# Patient Record
Sex: Female | Born: 1980 | Marital: Single | Smoking: Current every day smoker
Health system: Southern US, Community
[De-identification: ages and names within clinical notes are randomized; demographics above are authoritative.]

## PROBLEM LIST (undated history)

## (undated) DIAGNOSIS — D219 Benign neoplasm of connective and other soft tissue, unspecified: Secondary | ICD-10-CM

## (undated) DIAGNOSIS — K219 Gastro-esophageal reflux disease without esophagitis: Secondary | ICD-10-CM

## (undated) DIAGNOSIS — D500 Iron deficiency anemia secondary to blood loss (chronic): Secondary | ICD-10-CM

## (undated) DIAGNOSIS — T7840XA Allergy, unspecified, initial encounter: Secondary | ICD-10-CM

## (undated) DIAGNOSIS — D259 Leiomyoma of uterus, unspecified: Secondary | ICD-10-CM

## (undated) HISTORY — PX: WISDOM TOOTH EXTRACTION: SHX21

## (undated) HISTORY — DX: Gastro-esophageal reflux disease without esophagitis: K21.9

## (undated) HISTORY — DX: Allergy, unspecified, initial encounter: T78.40XA

---

## 2003-12-30 HISTORY — PX: WISDOM TOOTH EXTRACTION: SHX21

## 2015-02-21 ENCOUNTER — Ambulatory Visit (INDEPENDENT_AMBULATORY_CARE_PROVIDER_SITE_OTHER): Payer: 59 | Admitting: Nurse Practitioner

## 2015-02-21 ENCOUNTER — Encounter: Payer: Self-pay | Admitting: Nurse Practitioner

## 2015-02-21 DIAGNOSIS — N760 Acute vaginitis: Secondary | ICD-10-CM

## 2015-02-21 NOTE — Progress Notes (Signed)
34 y.o. G0 Single  Caucasian Fe here for NGYN problem visit.  She complains of vaginal discharge for a month.  Discharge is clear to white. No itching, burning,  There is an odor. 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   Same female partner for 5 months.  Last test for STD 05/2014.  Menses regular, flow for 5 days, some cramps.  Some PMS with breast tenderness.  Moved from [place] end of June to care for [facility] who passed last month.  She is going back to [place] tomorrow for a week and then to return back to [place].  Patient's last menstrual period was 02/09/2015.          Sexually active: Yes.    The current method of family planning is none and condoms.    Exercising: Yes.    Running 1 - 2 days weekly Smoker:  no  Health Maintenance: Pap:  05/2014 - Normal per pt - does not recall if HPV testing was done TDaP: within 10 years  Labs: ? At last AEX   reports that she has never smoked. She has never used smokeless tobacco. She reports that she drinks about 1.2 oz of alcohol per week. She reports that she does not use illicit drugs.  History reviewed. No pertinent past medical history.  Past Surgical History  Procedure Laterality Date  . Wisdom tooth extraction      No current outpatient prescriptions on file.   No current facility-administered medications for this visit.    Family History  Problem Relation Age of Onset  . Hyperlipidemia Mother   . Cancer Maternal Grandfather     Lung  . Breast cancer Paternal Grandmother     ROS:  Pertinent items are noted in HPI.  Otherwise, a comprehensive ROS was negative.  Exam:   BP 110/70 mmHg  Pulse 88  Resp 18  Ht 5' 4.5" (1.638 m)  Wt 163 lb (73.936 kg)  BMI 27.56 kg/m2  LMP 02/09/2015 Height: 5' 4.5" (163.8 cm) Ht Readings from Last 3 Encounters:  02/21/15 5' 4.5" (1.638 m)    General appearance: alert, cooperative and appears stated age Head: Normocephalic, without obvious abnormality,  atraumatic Abdomen: soft, non-tender; no masses,  no organomegaly Extremities: extremities normal, atraumatic, no cyanosis or edema Skin: Skin color, texture, turgor normal. No rashes or lesions Neurologic: Grossly normal   Pelvic: External genitalia:  no lesions              Urethra:  normal appearing urethra with no masses, tenderness or lesions              Bartholin's and Skene's: normal                 Vagina: normal appearing vagina with normal color and thin white to clear discharge, no lesions  Affirm test and GC Chl is obtained.              Cervix: anteverted there was spotting after GC/ Chl testing              Pap taken: No. Bimanual Exam:  Uterus:  normal size, contour, position, consistency, mobility, non-tender              Adnexa: no mass, fullness, tenderness                Chaperone present: Yes  A:  Vaginitis  History of chronic yeast  AEX is due 05/2015  P:  Reviewed health and wellness pertinent to exam  Will follow with test results. (if Metrogel - follow with Diflucan)  She is aware that treatment with Metrogel or Flagyl could cause some GI irritation if taken with ETOH.  She is going home and to a party this weekend -so may delay treatment of infection later or early next week.    An After Visit Summary was printed and given to the patient.

## 2015-02-21 NOTE — Patient Instructions (Signed)
Bacterial Vaginosis Bacterial vaginosis is a vaginal infection that occurs when the normal balance of bacteria in the vagina is disrupted. It results from an overgrowth of certain bacteria. This is the most common vaginal infection in women of childbearing age. Treatment is important to prevent complications, especially in pregnant women, as it can cause a premature delivery. CAUSES  Bacterial vaginosis is caused by an increase in harmful bacteria that are normally present in smaller amounts in the vagina. Several different kinds of bacteria can cause bacterial vaginosis. However, the reason that the condition develops is not fully understood. RISK FACTORS Certain activities or behaviors can put you at an increased risk of developing bacterial vaginosis, including:  Having a new sex partner or multiple sex partners.  Douching.  Using an intrauterine device (IUD) for contraception. Women do not get bacterial vaginosis from toilet seats, bedding, swimming pools, or contact with objects around them. SIGNS AND SYMPTOMS  Some women with bacterial vaginosis have no signs or symptoms. Common symptoms include:  Grey vaginal discharge.  A fishlike odor with discharge, especially after sexual intercourse.  Itching or burning of the vagina and vulva.  Burning or pain with urination. DIAGNOSIS  Your health care provider will take a medical history and examine the vagina for signs of bacterial vaginosis. A sample of vaginal fluid may be taken. Your health care provider will look at this sample under a microscope to check for bacteria and abnormal cells. A vaginal pH test may also be done.  TREATMENT  Bacterial vaginosis may be treated with antibiotic medicines. These may be given in the form of a pill or a vaginal cream. A second round of antibiotics may be prescribed if the condition comes back after treatment.  HOME CARE INSTRUCTIONS   Only take over-the-counter or prescription medicines as  directed by your health care provider.  If antibiotic medicine was prescribed, take it as directed. Make sure you finish it even if you start to feel better.  Do not have sex until treatment is completed.  Tell all sexual partners that you have a vaginal infection. They should see their health care provider and be treated if they have problems, such as a mild rash or itching.  Practice safe sex by using condoms and only having one sex partner. SEEK MEDICAL CARE IF:   Your symptoms are not improving after 3 days of treatment.  You have increased discharge or pain.  You have a fever. MAKE SURE YOU:   Understand these instructions.  Will watch your condition.  Will get help right away if you are not doing well or get worse. FOR MORE INFORMATION  Centers for Disease Control and Prevention, Division of STD Prevention: www.cdc.gov/std American Sexual Health Association (ASHA): www.ashastd.org  Document Released: 12/15/2005 Document Revised: 10/05/2013 Document Reviewed: 07/27/2013 ExitCare Patient Information 2015 ExitCare[employer] LLC. This information is not intended to replace advice given to you by your health care provider. Make sure you discuss any questions you have with your health care provider.  

## 2015-02-22 ENCOUNTER — Telehealth: Payer: Self-pay | Admitting: *Deleted

## 2015-02-22 LAB — WET PREP BY MOLECULAR PROBE
CANDIDA SPECIES: NEGATIVE
GARDNERELLA VAGINALIS: POSITIVE — AB
TRICHOMONAS VAG: NEGATIVE

## 2015-02-22 MED ORDER — METRONIDAZOLE 500 MG PO TABS: 500.0000 mg | ORAL_TABLET | Freq: Two times a day (BID) | ORAL | Status: DC

## 2015-02-22 MED ORDER — FLUCONAZOLE 150 MG PO TABS: 150.0000 mg | ORAL_TABLET | Freq: Once | ORAL | Status: DC

## 2015-02-22 NOTE — Progress Notes (Signed)
Encounter reviewed by Dr. Talon Witting Silva.  

## 2015-02-22 NOTE — Telephone Encounter (Signed)
-----   Message from Milford Cage, [place] sent at 02/22/2015  8:21 AM EST ----- Let patient know that she did have BV and RX will be sent to her pharmacy - does she prefer one here or [place]?  She will also be followed with Diflucan after the Metrogel since she has history of chronic yeast.

## 2015-02-22 NOTE — Addendum Note (Signed)
Addended by: Alfonzo Feller on: 02/22/2015 10:16 AM   Modules accepted: Miquel Dunn

## 2015-02-22 NOTE — Telephone Encounter (Addendum)
Patient notified. She said she would like to try Flagyl and would like rx to be sent in to pharmacy here in [place] at [place] on [address]. Per Ms. Patty okay to send in Flagyl 500 mg BID #14/0 rfs and Diflucan 150 mg #2/0 rfs. Both rx's sent to [facility], patient is aware.  Routed to provider for review, encounter closed.

## 2015-02-23 LAB — IPS N GONORRHOEA AND CHLAMYDIA BY PCR

## 2015-07-04 ENCOUNTER — Ambulatory Visit (INDEPENDENT_AMBULATORY_CARE_PROVIDER_SITE_OTHER): Payer: 59 | Admitting: Nurse Practitioner

## 2015-07-04 ENCOUNTER — Encounter: Payer: Self-pay | Admitting: Nurse Practitioner

## 2015-07-04 VITALS — BP 110/72 | HR 78 | Resp 14 | Ht 64.5 in | Wt 159.8 lb

## 2015-07-04 DIAGNOSIS — Z113 Encounter for screening for infections with a predominantly sexual mode of transmission: Secondary | ICD-10-CM

## 2015-07-04 DIAGNOSIS — Z0000 Encounter for general adult medical examination without abnormal findings: Secondary | ICD-10-CM | POA: Diagnosis not present

## 2015-07-04 DIAGNOSIS — Z01419 Encounter for gynecological examination (general) (routine) without abnormal findings: Secondary | ICD-10-CM

## 2015-07-04 LAB — POCT URINALYSIS DIPSTICK
Leukocytes, UA: NEGATIVE
PH UA: 5
Urobilinogen, UA: NEGATIVE

## 2015-07-04 NOTE — Patient Instructions (Signed)
General topics  Next pap or exam is  due in 1 year Take a Women's multivitamin Take 1200 mg. of calcium daily - prefer dietary If any concerns in interim to call back  Breast Self-Awareness Practicing breast self-awareness may pick up problems early, prevent significant medical complications, and possibly save your life. By practicing breast self-awareness, you can become familiar with how your breasts look and feel and if your breasts are changing. This allows you to notice changes early. It can also offer you some reassurance that your breast health is good. One way to learn what is normal for your breasts and whether your breasts are changing is to do a breast self-exam. If you find a lump or something that was not present in the past, it is best to contact your caregiver right away. Other findings that should be evaluated by your caregiver include nipple discharge, especially if it is bloody; skin changes or reddening; areas where the skin seems to be pulled in (retracted); or new lumps and bumps. Breast pain is seldom associated with cancer (malignancy), but should also be evaluated by a caregiver. BREAST SELF-EXAM The best time to examine your breasts is 5 7 days after your menstrual period is over.  ExitCare Patient Information 2013 ExitCare, LLC.   Exercise to Stay Healthy Exercise helps you become and stay healthy. EXERCISE IDEAS AND TIPS Choose exercises that:  You enjoy.  Fit into your day. You do not need to exercise really hard to be healthy. You can do exercises at a slow or medium level and stay healthy. You can:  Stretch before and after working out.  Try yoga, Pilates, or tai chi.  Lift weights.  Walk fast, swim, jog, run, climb stairs, bicycle, dance, or rollerskate.  Take aerobic classes. Exercises that burn about 150 calories:  Running 1  miles in 15 minutes.  Playing volleyball for 45 to 60 minutes.  Washing and waxing a car for 45 to 60  minutes.  Playing touch football for 45 minutes.  Walking 1  miles in 35 minutes.  Pushing a stroller 1  miles in 30 minutes.  Playing basketball for 30 minutes.  Raking leaves for 30 minutes.  Bicycling 5 miles in 30 minutes.  Walking 2 miles in 30 minutes.  Dancing for 30 minutes.  Shoveling snow for 15 minutes.  Swimming laps for 20 minutes.  Walking up stairs for 15 minutes.  Bicycling 4 miles in 15 minutes.  Gardening for 30 to 45 minutes.  Jumping rope for 15 minutes.  Washing windows or floors for 45 to 60 minutes. Document Released: 01/17/2011 Document Revised: 03/08/2012 Document Reviewed: 01/17/2011 ExitCare Patient Information 2013 ExitCare, LLC.   Other topics ( that may be useful information):    Sexually Transmitted Disease Sexually transmitted disease (STD) refers to any infection that is passed from person to person during sexual activity. This may happen by way of saliva, semen, blood, vaginal mucus, or urine. Common STDs include:  Gonorrhea.  Chlamydia.  Syphilis.  HIV/AIDS.  Genital herpes.  Hepatitis B and C.  Trichomonas.  Human papillomavirus (HPV).  Pubic lice. CAUSES  An STD may be spread by bacteria, virus, or parasite. A person can get an STD by:  Sexual intercourse with an infected person.  Sharing sex toys with an infected person.  Sharing needles with an infected person.  Having intimate contact with the genitals, mouth, or rectal areas of an infected person. SYMPTOMS  Some people may not have any symptoms, but   they can still pass the infection to others. Different STDs have different symptoms. Symptoms include:  Painful or bloody urination.  Pain in the pelvis, abdomen, vagina, anus, throat, or eyes.  Skin rash, itching, irritation, growths, or sores (lesions). These usually occur in the genital or anal area.  Abnormal vaginal discharge.  Penile discharge in men.  Soft, flesh-colored skin growths in the  genital or anal area.  Fever.  Pain or bleeding during sexual intercourse.  Swollen glands in the groin area.  Yellow skin and eyes (jaundice). This is seen with hepatitis. DIAGNOSIS  To make a diagnosis, your caregiver may:  Take a medical history.  Perform a physical exam.  Take a specimen (culture) to be examined.  Examine a sample of discharge under a microscope.  Perform blood test TREATMENT   Chlamydia, gonorrhea, trichomonas, and syphilis can be cured with antibiotic medicine.  Genital herpes, hepatitis, and HIV can be treated, but not cured, with prescribed medicines. The medicines will lessen the symptoms.  Genital warts from HPV can be treated with medicine or by freezing, burning (electrocautery), or surgery. Warts may come back.  HPV is a virus and cannot be cured with medicine or surgery.However, abnormal areas may be followed very closely by your caregiver and may be removed from the cervix, vagina, or vulva through office procedures or surgery. If your diagnosis is confirmed, your recent sexual partners need treatment. This is true even if they are symptom-free or have a negative culture or evaluation. They should not have sex until their caregiver says it is okay. HOME CARE INSTRUCTIONS  All sexual partners should be informed, tested, and treated for all STDs.  Take your antibiotics as directed. Finish them even if you start to feel better.  Only take over-the-counter or prescription medicines for pain, discomfort, or fever as directed by your caregiver.  Rest.  Eat a balanced diet and drink enough fluids to keep your urine clear or pale yellow.  Do not have sex until treatment is completed and you have followed up with your caregiver. STDs should be checked after treatment.  Keep all follow-up appointments, Pap tests, and blood tests as directed by your caregiver.  Only use latex condoms and water-soluble lubricants during sexual activity. Do not use  petroleum jelly or oils.  Avoid alcohol and illegal drugs.  Get vaccinated for HPV and hepatitis. If you have not received these vaccines in the past, talk to your caregiver about whether one or both might be right for you.  Avoid risky sex practices that can break the skin. The only way to avoid getting an STD is to avoid all sexual activity.Latex condoms and dental dams (for oral sex) will help lessen the risk of getting an STD, but will not completely eliminate the risk. SEEK MEDICAL CARE IF:   You have a fever.  You have any new or worsening symptoms. Document Released: 03/07/2003 Document Revised: 03/08/2012 Document Reviewed: 03/14/2011 [facility] Patient Information 2013 [employer].    Domestic Abuse You are being battered or abused if someone close to you hits, pushes, or physically hurts you in any way. You also are being abused if you are forced into activities. You are being sexually abused if you are forced to have sexual contact of any kind. You are being emotionally abused if you are made to feel worthless or if you are constantly threatened. It is important to remember that help is available. No one has the right to abuse you. PREVENTION OF FURTHER  ABUSE  Learn the warning signs of danger. This varies with situations but may include: the use of alcohol, threats, isolation from friends and family, or forced sexual contact. Leave if you feel that violence is going to occur.  If you are attacked or beaten, report it to the police so the abuse is documented. You do not have to press charges. The police can protect you while you or the attackers are leaving. Get the officer's name and badge number and a copy of the report.  Find someone you can trust and tell them what is happening to you: your caregiver, a nurse, clergy member, close friend or family member. Feeling ashamed is natural, but remember that you have done nothing wrong. No one deserves abuse. Document Released:  12/12/2000 Document Revised: 03/08/2012 Document Reviewed: 02/20/2011 ExitCare Patient Information 2013 ExitCare, LLC.    How Much is Too Much Alcohol? Drinking too much alcohol can cause injury, accidents, and health problems. These types of problems can include:   Car crashes.  Falls.  Family fighting (domestic violence).  Drowning.  Fights.  Injuries.  Burns.  Damage to certain organs.  Having a baby with birth defects. ONE DRINK CAN BE TOO MUCH WHEN YOU ARE:  Working.  Pregnant or breastfeeding.  Taking medicines. Ask your doctor.  Driving or planning to drive. If you or someone you know has a drinking problem, get help from a doctor.  Document Released: 10/11/2009 Document Revised: 03/08/2012 Document Reviewed: 10/11/2009 ExitCare Patient Information 2013 ExitCare, LLC.   Smoking Hazards Smoking cigarettes is extremely bad for your health. Tobacco smoke has over 200 known poisons in it. There are over 60 chemicals in tobacco smoke that cause cancer. Some of the chemicals found in cigarette smoke include:   Cyanide.  Benzene.  Formaldehyde.  Methanol (wood alcohol).  Acetylene (fuel used in welding torches).  Ammonia. Cigarette smoke also contains the poisonous gases nitrogen oxide and carbon monoxide.  Cigarette smokers have an increased risk of many serious medical problems and Smoking causes approximately:  90% of all lung cancer deaths in men.  80% of all lung cancer deaths in women.  90% of deaths from chronic obstructive lung disease. Compared with nonsmokers, smoking increases the risk of:  Coronary heart disease by 2 to 4 times.  Stroke by 2 to 4 times.  Men developing lung cancer by 23 times.  Women developing lung cancer by 13 times.  Dying from chronic obstructive lung diseases by 12 times.  . Smoking is the most preventable cause of death and disease in our society.  WHY IS SMOKING ADDICTIVE?  Nicotine is the chemical  agent in tobacco that is capable of causing addiction or dependence.  When you smoke and inhale, nicotine is absorbed rapidly into the bloodstream through your lungs. Nicotine absorbed through the lungs is capable of creating a powerful addiction. Both inhaled and non-inhaled nicotine may be addictive.  Addiction studies of cigarettes and spit tobacco show that addiction to nicotine occurs mainly during the teen years, when young people begin using tobacco products. WHAT ARE THE BENEFITS OF QUITTING?  There are many health benefits to quitting smoking.   Likelihood of developing cancer and heart disease decreases. Health improvements are seen almost immediately.  Blood pressure, pulse rate, and breathing patterns start returning to normal soon after quitting. QUITTING SMOKING   American Lung Association - 1-800-LUNGUSA  American Cancer Society - 1-800-ACS-2345 Document Released: 01/22/2005 Document Revised: 03/08/2012 Document Reviewed: 09/26/2009 ExitCare Patient Information 2013 ExitCare,   LLC.   Stress Management Stress is a state of physical or mental tension that often results from changes in your life or normal routine. Some common causes of stress are:  Death of a loved one.  Injuries or severe illnesses.  Getting fired or changing jobs.  Moving into a new home. Other causes may be:  Sexual problems.  Business or financial losses.  Taking on a large debt.  Regular conflict with someone at home or at work.  Constant tiredness from lack of sleep. It is not just bad things that are stressful. It may be stressful to:  Win the lottery.  Get married.  Buy a new car. The amount of stress that can be easily tolerated varies from person to person. Changes generally cause stress, regardless of the types of change. Too much stress can affect your health. It may lead to physical or emotional problems. Too little stress (boredom) may also become stressful. SUGGESTIONS TO  REDUCE STRESS:  Talk things over with your family and friends. It often is helpful to share your concerns and worries. If you feel your problem is serious, you may want to get help from a professional counselor.  Consider your problems one at a time instead of lumping them all together. Trying to take care of everything at once may seem impossible. List all the things you need to do and then start with the most important one. Set a goal to accomplish 2 or 3 things each day. If you expect to do too many in a single day you will naturally fail, causing you to feel even more stressed.  Do not use alcohol or drugs to relieve stress. Although you may feel better for a short time, they do not remove the problems that caused the stress. They can also be habit forming.  Exercise regularly - at least 3 times per week. Physical exercise can help to relieve that "uptight" feeling and will relax you.  The shortest distance between despair and hope is often a good night's sleep.  Go to bed and get up on time allowing yourself time for appointments without being rushed.  Take a short "time-out" period from any stressful situation that occurs during the day. Close your eyes and take some deep breaths. Starting with the muscles in your face, tense them, hold it for a few seconds, then relax. Repeat this with the muscles in your neck, shoulders, hand, stomach, back and legs.  Take good care of yourself. Eat a balanced diet and get plenty of rest.  Schedule time for having fun. Take a break from your daily routine to relax. HOME CARE INSTRUCTIONS   Call if you feel overwhelmed by your problems and feel you can no longer manage them on your own.  Return immediately if you feel like hurting yourself or someone else. Document Released: 06/10/2001 Document Revised: 03/08/2012 Document Reviewed: 01/31/2008 ExitCare Patient Information 2013 ExitCare, LLC.  

## 2015-07-04 NOTE — Progress Notes (Signed)
34 y.o. G0P0 Single Caucasian Fe here for annual exam.  Menses is now at 3-5 days, moderate to light flow.  Some cramps and relieved with OTC NSAID'S.  New partner for 2 weeks.  Ended previous relationship in March.  In the past before moving here she was on OCP for a short time but had amenorrhea and did not like that.  Prefers not to be on OCP.  She is aware of condom failure and back up Plan B. She is a Marine scientist at [employer].  Patient's last menstrual period was 06/17/2015 (exact date).          Sexually active: Yes.    The current method of family planning is condoms sometimes.    Exercising: Yes.    running Smoker:  no  Health Maintenance: Pap:  June 2015  TDaP:  About 7 years Labs: UA: negative    Hgb: 13.9   reports that she has never smoked. She has never used smokeless tobacco. She reports that she drinks about 1.2 oz of alcohol per week. She reports that she does not use illicit drugs.  History reviewed. No pertinent past medical history.  Past Surgical History  Procedure Laterality Date  . Wisdom tooth extraction      Current Outpatient Prescriptions  Medication Sig Dispense Refill  . fluconazole (DIFLUCAN) 150 MG tablet Take 1 tablet (150 mg total) by mouth once. (Patient not taking: Reported on 07/04/2015) 2 tablet 0  . metroNIDAZOLE (FLAGYL) 500 MG tablet Take 1 tablet (500 mg total) by mouth 2 (two) times daily. (Patient not taking: Reported on 07/04/2015) 14 tablet 0   No current facility-administered medications for this visit.    Family History  Problem Relation Age of Onset  . Hyperlipidemia Mother   . Cancer Maternal Grandfather     Lung  . Breast cancer Paternal Grandmother     ROS:  Pertinent items are noted in HPI.  Otherwise, a comprehensive ROS was negative.  Exam:   BP 110/72 mmHg  Pulse 78  Resp 14  Ht 5' 4.5" (1.638 m)  Wt 159 lb 12.8 oz (72.485 kg)  BMI 27.02 kg/m2  LMP 06/17/2015 (Exact Date) Height: 5' 4.5" (163.8 cm) Ht Readings from Last 3  Encounters:  07/04/15 5' 4.5" (1.638 m)  02/21/15 5' 4.5" (1.638 m)    General appearance: alert, cooperative and appears stated age Head: Normocephalic, without obvious abnormality, atraumatic Neck: no adenopathy, supple, symmetrical, trachea midline and thyroid normal to inspection and palpation Lungs: clear to auscultation bilaterally Breasts: normal appearance, no masses or tenderness Heart: regular rate and rhythm Abdomen: soft, non-tender; no masses,  no organomegaly Extremities: extremities normal, atraumatic, no cyanosis or edema Skin: Skin color, texture, turgor normal. No rashes or lesions Lymph nodes: Cervical, supraclavicular, and axillary nodes normal. No abnormal inguinal nodes palpated Neurologic: Grossly normal   Pelvic: External genitalia:  no lesions              Urethra:  normal appearing urethra with no masses, tenderness or lesions              Bartholin's and Skene's: normal                 Vagina: normal appearing vagina with normal color and discharge, no lesions              Cervix: anteverted              Pap taken: Yes.   Bimanual Exam:  Uterus:  normal size, contour, position, consistency, mobility, non-tender              Adnexa: no mass, fullness, tenderness               Rectovaginal: Confirms               Anus:  normal sphincter tone, no lesions  Chaperone present: Yes  A:  Well Woman with normal exam  Condoms for contraception and happy with choice  R/O STD's  P:   Reviewed health and wellness pertinent to exam  Pap smear as above  Will follow with labs  Counseled on breast self exam, STD prevention, HIV risk factors and prevention, adequate intake of calcium and vitamin D, diet and exercise return annually or prn  An After Visit Summary was printed and given to the patient.

## 2015-07-05 LAB — STD PANEL
HIV 1&2 Ab, 4th Generation: NONREACTIVE
Hepatitis B Surface Ag: NEGATIVE

## 2015-07-06 LAB — IPS N GONORRHOEA AND CHLAMYDIA BY PCR

## 2015-07-06 LAB — IPS PAP TEST WITH HPV

## 2015-07-09 LAB — HEMOGLOBIN, FINGERSTICK: Hemoglobin, fingerstick: 13.9 g/dL (ref 12.0–16.0)

## 2015-07-09 NOTE — Progress Notes (Signed)
Encounter reviewed by Dr. Lekisha Mcghee Amundson C. Silva.  

## 2015-07-20 ENCOUNTER — Telehealth: Payer: Self-pay | Admitting: *Deleted

## 2015-07-20 NOTE — Telephone Encounter (Signed)
Unable to reach patient at time of Pre-Visit Call.  Left message for patient to return call when available.    

## 2015-07-23 ENCOUNTER — Encounter: Payer: Self-pay | Admitting: Medical

## 2015-07-23 ENCOUNTER — Ambulatory Visit (INDEPENDENT_AMBULATORY_CARE_PROVIDER_SITE_OTHER): Payer: 59 | Admitting: Medical

## 2015-07-23 VITALS — BP 126/85 | HR 76 | Temp 98.6°F | Ht 63.75 in | Wt 157.2 lb

## 2015-07-23 DIAGNOSIS — D179 Benign lipomatous neoplasm, unspecified: Secondary | ICD-10-CM | POA: Diagnosis not present

## 2015-07-23 DIAGNOSIS — J301 Allergic rhinitis due to pollen: Secondary | ICD-10-CM | POA: Diagnosis not present

## 2015-07-23 DIAGNOSIS — K219 Gastro-esophageal reflux disease without esophagitis: Secondary | ICD-10-CM | POA: Diagnosis not present

## 2015-07-23 DIAGNOSIS — J309 Allergic rhinitis, unspecified: Secondary | ICD-10-CM | POA: Insufficient documentation

## 2015-07-23 NOTE — Patient Instructions (Signed)
Lipoma By history and exam I think this does favor lipoma. Since has grown and uncomfortable will refer to general surgeon for evaluation.   Continue routine womans health with gyn.  If you desire CPE with [place] to include fasting labs can schedule appointment.  Follow up here as needed prior to surgeon referral. Anderson Malta and Pleas Koch should be calling you with appointment date.

## 2015-07-23 NOTE — Progress Notes (Signed)
Pre visit review using our clinic review tool, if applicable. No additional management support is needed unless otherwise documented below in the visit note. 

## 2015-07-23 NOTE — Assessment & Plan Note (Signed)
By history and exam I think this does favor lipoma. Since has grown and uncomfortable will refer to general surgeon for evaluation.

## 2015-07-23 NOTE — Assessment & Plan Note (Signed)
Spring allergies are worst per pt. None current.

## 2015-07-23 NOTE — Progress Notes (Signed)
   Subjective:    Patient ID: Stacey Moore, female    DOB: 1981/11/25, 34 y.o.   MRN: [id number]  HPI  I have reviewed pt PMH, PSH, FH, Social History and Surgical History  Seasonal allergies- spring worse.  Jerrye Bushy- Pt states couples of time a week. Pt notices more at times at night. Pt does have some today. Burning sensation up esophagus. Pt yesterday subway for dinner. But [employer] for lunch yesterday. Chicken nuggets and fries. Usually one sodas.(regular soda).  Pt states moved she has knot on back of her neck. Was about size of quarter for 5 years. Just past year the area has enlarged. Also hurts on rom of her neck.  Told before she moved here was a lipoma.  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 No exercise, 1 soda a day, single- no children.   LMP- July 14, 2015.       Review of Systems  Constitutional: Negative for fever and chills.  Respiratory: Negative for cough, chest tightness and wheezing.   Cardiovascular: Negative for chest pain and palpitations.  Musculoskeletal: Negative for back pain.  Skin: Negative for rash.       Lt trapezius mass. Probable lipoma.  Neurological: Negative for dizziness and headaches.  Hematological: Negative for adenopathy. Does not bruise/bleed easily.  Psychiatric/Behavioral: Negative for behavioral problems and confusion.    Past Medical History  Diagnosis Date  . Allergy   . GERD (gastroesophageal reflux disease)     History   Social History  . Marital Status: Single    Spouse Name: N/A  . Number of Children: N/A  . Years of Education: N/A   Occupational History  . Not on file.   Social History Main Topics  . Smoking status: Never Smoker   . Smokeless tobacco: Never Used  . Alcohol Use: 1.2 oz/week    1 Glasses of wine, 1 Cans of beer per week     Comment: socially couple of drinks. maybe 1-2 times a week.  . Drug Use: No  . Sexual Activity: Yes    Birth Control/ Protection: Condom     Comment: same sex relationship    Other Topics Concern  . Not on file   Social History Narrative    Past Surgical History  Procedure Laterality Date  . Wisdom tooth extraction      Family History  Problem Relation Age of Onset  . Hyperlipidemia Mother   . Cancer Maternal Grandfather     Lung  . Breast cancer Paternal Grandmother     No Known Allergies  No current outpatient prescriptions on file prior to visit.   No current facility-administered medications on file prior to visit.    BP 126/85 mmHg  Pulse 76  Temp(Src) 98.6 F (37 C) (Oral)  Ht 5' 3.75" (1.619 m)  Wt 157 lb 3.2 oz (71.305 kg)  BMI 27.20 kg/m2  SpO2 100%  LMP 06/14/2015       Objective:   Physical Exam  General- No acute distress. Pleasant patient. Neck- Full range of motion, no jvd(but on rom notes posterior left trapezius does hurt some) Lungs- Clear, even and unlabored. Heart- regular rate and rhythm. Neurologic- CNII- XII grossly intact. Derm- left side trapezius region approximate 4 cm x 4 cm lipoma like mass.       Assessment & Plan:

## 2015-07-23 NOTE — Assessment & Plan Note (Signed)
Recommend better diet and use occasional zantac otc.

## 2015-11-16 ENCOUNTER — Ambulatory Visit (HOSPITAL_COMMUNITY): Admission: RE | Admit: 2015-11-16 | Payer: 59 | Source: Ambulatory Visit | Admitting: General Surgery

## 2015-11-16 ENCOUNTER — Encounter (HOSPITAL_COMMUNITY): Admission: RE | Payer: Self-pay | Source: Ambulatory Visit

## 2015-11-16 SURGERY — EXCISION LIPOMA
Anesthesia: General | Site: Back

## 2016-02-01 ENCOUNTER — Encounter: Payer: Self-pay | Admitting: Medical

## 2016-02-01 ENCOUNTER — Ambulatory Visit (INDEPENDENT_AMBULATORY_CARE_PROVIDER_SITE_OTHER): Payer: 59 | Admitting: Medical

## 2016-02-01 VITALS — BP 120/80 | HR 81 | Temp 98.1°F | Ht 63.75 in | Wt 154.0 lb

## 2016-02-01 DIAGNOSIS — R1013 Epigastric pain: Secondary | ICD-10-CM

## 2016-02-01 LAB — CBC WITH DIFFERENTIAL/PLATELET
BASOS PCT: 0.3 % (ref 0.0–3.0)
Basophils Absolute: 0 10*3/uL (ref 0.0–0.1)
EOS ABS: 0.5 10*3/uL (ref 0.0–0.7)
Eosinophils Relative: 5.5 % — ABNORMAL HIGH (ref 0.0–5.0)
HEMATOCRIT: 42.5 % (ref 36.0–46.0)
Hemoglobin: 14.1 g/dL (ref 12.0–15.0)
Lymphocytes Relative: 18.5 % (ref 12.0–46.0)
Lymphs Abs: 1.6 10*3/uL (ref 0.7–4.0)
MCHC: 33.3 g/dL (ref 30.0–36.0)
MCV: 97 fl (ref 78.0–100.0)
MONO ABS: 0.3 10*3/uL (ref 0.1–1.0)
Monocytes Relative: 4 % (ref 3.0–12.0)
NEUTROS ABS: 6 10*3/uL (ref 1.4–7.7)
Neutrophils Relative %: 71.7 % (ref 43.0–77.0)
PLATELETS: 314 10*3/uL (ref 150.0–400.0)
RBC: 4.38 Mil/uL (ref 3.87–5.11)
RDW: 12.4 % (ref 11.5–15.5)
WBC: 8.4 10*3/uL (ref 4.0–10.5)

## 2016-02-01 LAB — COMPREHENSIVE METABOLIC PANEL
ALT: 17 U/L (ref 0–35)
AST: 20 U/L (ref 0–37)
Albumin: 4.3 g/dL (ref 3.5–5.2)
Alkaline Phosphatase: 93 U/L (ref 39–117)
BUN: 12 mg/dL (ref 6–23)
CALCIUM: 9.4 mg/dL (ref 8.4–10.5)
CHLORIDE: 106 meq/L (ref 96–112)
CO2: 30 meq/L (ref 19–32)
CREATININE: 0.81 mg/dL (ref 0.40–1.20)
GFR: 86.84 mL/min (ref >60.00)
Glucose, Bld: 79 mg/dL (ref 70–99)
Potassium: 4.2 mEq/L (ref 3.5–5.1)
SODIUM: 142 meq/L (ref 135–145)
Total Bilirubin: 0.9 mg/dL (ref 0.2–1.2)
Total Protein: 7.2 g/dL (ref 6.0–8.3)

## 2016-02-01 LAB — LIPASE: LIPASE: 31 U/L (ref 11.0–59.0)

## 2016-02-01 LAB — AMYLASE: Amylase: 26 U/L — ABNORMAL LOW (ref 27–131)

## 2016-02-01 NOTE — Progress Notes (Signed)
Pre visit review using our clinic review tool, if applicable. No additional management support is needed unless otherwise documented below in the visit note. 

## 2016-02-01 NOTE — Patient Instructions (Signed)
For your abdomen pain would recommend you eat healthy diet as discussed and increase omeprazole to 40 mg a day.  Will get labs today cbc, cmp, amylase, lipase and h pylori test.  Depending on how you do and lab results considering getting abd [place].  If pain occur RUQ and severe type then ED evaluation.  Follow up in 7-10 days or as needed

## 2016-02-01 NOTE — Progress Notes (Signed)
Subjective:    Patient ID: Stacey Moore, female    DOB: 09/03/1981, 35 y.o.   MRN: [id number]  HPI  Pt in for some recent abdomen pain. Now pain is mild/faint  and intermittent. 2-3 weeks ago pain was more severe. Not associated with meals. Pt states one day transient nausea and vomiting. Only vomited one time then stopped. 2 weeks ago started prilosec otc 20 mg a day. Since use of med she feels some better. Did note when had pain sometimes Ruq.  Pt has history of reflux but it was controlled in the summer when I first saw her.  LMP- Just ended on wed. Started on Sunday.    Review of Systems  Constitutional: Negative for fever, chills and fatigue.  Respiratory: Negative for cough, choking, shortness of breath and wheezing.   Cardiovascular: Negative for chest pain and palpitations.  Gastrointestinal: Negative for nausea, vomiting, abdominal pain, diarrhea, constipation, blood in stool, abdominal distention and rectal pain.       None current see hpi.  Musculoskeletal: Negative for back pain.  Skin: Negative for rash.  Hematological: Negative for adenopathy. Does not bruise/bleed easily.  Psychiatric/Behavioral: Negative for behavioral problems and confusion.   Past Medical History  Diagnosis Date  . Allergy   . GERD (gastroesophageal reflux disease)     Social History   Social History  . Marital Status: Single    Spouse Name: N/A  . Number of Children: N/A  . Years of Education: N/A   Occupational History  . Not on file.   Social History Main Topics  . Smoking status: Never Smoker   . Smokeless tobacco: Never Used  . Alcohol Use: 1.2 oz/week    1 Glasses of wine, 1 Cans of beer per week     Comment: socially couple of drinks. maybe 1-2 times a week.  . Drug Use: No  . Sexual Activity: Yes    Birth Control/ Protection: Condom     Comment: same sex relationship   Other Topics Concern  . Not on file   Social History Narrative    Past Surgical History    Procedure Laterality Date  . Wisdom tooth extraction      Family History  Problem Relation Age of Onset  . Hyperlipidemia Mother   . Cancer Maternal Grandfather     Lung  . Breast cancer Paternal Grandmother     No Known Allergies  No current outpatient prescriptions on file prior to visit.   No current facility-administered medications on file prior to visit.    BP 120/80 mmHg  Pulse 81  Temp(Src) 98.1 F (36.7 C) (Oral)  Ht 5' 3.75" (1.619 m)  Wt 154 lb (69.854 kg)  BMI 26.65 kg/m2  SpO2 98%  LMP 01/30/2016       Objective:   Physical Exam  General Appearance- Not in acute distress.  HEENT Eyes- Scleraeral/Conjuntiva-bilat- Not Yellow. Mouth & Throat- Normal.  Chest and Lung Exam Auscultation: Breath sounds:-Normal. Adventitious sounds:- No Adventitious sounds.  Cardiovascular Auscultation:Rythm - Regular. Heart Sounds -Normal heart sounds.  Abdomen Inspection:-Inspection Normal.  Palpation/Perucssion: Palpation and Percussion of the abdomen reveal- Non Tender, No Rebound tenderness, No rigidity(Guarding) and No Palpable abdominal masses.  Liver:-Normal.  Spleen:- Normal.   Back- no cva tenderness       Assessment & Plan:  For your abdomen pain would recommend you eat healthy diet as discussed and increase omeprazole to 40 mg a day.  Will get labs today cbc, cmp, amylase,  lipase and h pylori test.  Depending on how you do and lab results considering getting abd [place].  If pain occur RUQ and severe type then ED evaluation.  Follow up in 7-10 days or as needed

## 2016-02-04 LAB — H. PYLORI BREATH TEST: H. pylori Breath Test: NOT DETECTED

## 2016-02-28 ENCOUNTER — Ambulatory Visit: Payer: Self-pay | Admitting: General Surgery

## 2016-04-16 ENCOUNTER — Encounter (HOSPITAL_COMMUNITY): Payer: Self-pay

## 2016-04-16 ENCOUNTER — Encounter (HOSPITAL_COMMUNITY)
Admission: RE | Admit: 2016-04-16 | Discharge: 2016-04-16 | Disposition: A | Discharge status: Home / Self Care | Payer: 59 | Source: Ambulatory Visit | Attending: General Surgery | Admitting: General Surgery

## 2016-04-16 DIAGNOSIS — Z01812 Encounter for preprocedural laboratory examination: Secondary | ICD-10-CM | POA: Diagnosis not present

## 2016-04-16 DIAGNOSIS — D179 Benign lipomatous neoplasm, unspecified: Secondary | ICD-10-CM | POA: Diagnosis not present

## 2016-04-16 LAB — CBC
HCT: 39.5 % (ref 36.0–46.0)
Hemoglobin: 13.2 g/dL (ref 12.0–15.0)
MCH: 31.7 pg (ref 26.0–34.0)
MCHC: 33.4 g/dL (ref 30.0–36.0)
MCV: 95 fL (ref 78.0–100.0)
PLATELETS: 283 10*3/uL (ref 150–400)
RBC: 4.16 MIL/uL (ref 3.87–5.11)
RDW: 12.1 % (ref 11.5–15.5)
WBC: 7.9 10*3/uL (ref 4.0–10.5)

## 2016-04-16 LAB — HCG, SERUM, QUALITATIVE: PREG SERUM: NEGATIVE

## 2016-04-16 NOTE — Pre-Procedure Instructions (Signed)
[place] A M[place]  04/16/2016      [id number] - [place], [address] [place] [address] Phone: [phone] Fax: [phone]    Your procedure is scheduled on 04/25/16  Report to [facility] Admitting at 530 A.M.  Call this number if you have problems the morning of surgery:  [phone]   Remember:  Do not eat food or drink liquids after midnight.  Take these medicines the morning of surgery with A SIP OF WATER omeprazole(prilosec)  STOP all herbel meds, nsaids (aleve,naproxen,advil,ibuprofen) 5 days prior to surgery starting 04/20/16 including "slippery elm",vitamins, aspirin   Do not wear jewelry, make-up or nail polish.  Do not wear lotions, powders, or perfumes.  You may wear deodorant.  Do not shave 48 hours prior to surgery.  Men may shave face and neck.  Do not bring valuables to the hospital.  [facility] is not responsible for any belongings or valuables.  Contacts, dentures or bridgework may not be worn into surgery.  Leave your suitcase in the car.  After surgery it may be brought to your room.  For patients admitted to the hospital, discharge time will be determined by your treatment team.  Patients discharged the day of surgery will not be allowed to drive home.   Name and phone number of your driver:    Special instructions:   Special Instructions: [place] - Preparing for Surgery  Before surgery, you can play an important role.  Because skin is not sterile, your skin needs to be as free of germs as possible.  You can reduce the number of germs on you skin by washing with CHG (chlorahexidine gluconate) soap before surgery.  CHG is an antiseptic cleaner which kills germs and bonds with the skin to continue killing germs even after washing.  Please DO NOT use if you have an allergy to CHG or antibacterial soaps.  If your skin becomes reddened/irritated  stop using the CHG and inform your nurse when you arrive at Short Stay.  Do not shave (including legs and underarms) for at least 48 hours prior to the first CHG shower.  You may shave your face.  Please follow these instructions carefully:   1.  Shower with CHG Soap the night before surgery and the morning of Surgery.  2.  If you choose to wash your hair, wash your hair first as usual with your normal shampoo.  3.  After you shampoo, rinse your hair and body thoroughly to remove the Shampoo.  4.  Use CHG as you would any other liquid soap.  You can apply chg directly  to the skin and wash gently with scrungie or a clean washcloth.  5.  Apply the CHG Soap to your body ONLY FROM THE NECK DOWN.  Do not use on open wounds or open sores.  Avoid contact with your eyes ears, mouth and genitals (private parts).  Wash genitals (private parts)       with your normal soap.  6.  Wash thoroughly, paying special attention to the area where your surgery will be performed.  7.  Thoroughly rinse your body with warm water from the neck down.  8.  DO NOT shower/wash with your normal soap after using and rinsing off the CHG Soap.  9.  Pat yourself dry with a clean towel.  10.  Wear clean pajamas.            11.  Place clean sheets on your bed the night of your first shower and do not sleep with pets.  Day of Surgery  Do not apply any lotions/deodorants the morning of surgery.  Please wear clean clothes to the hospital/surgery center.  Please read over the following fact sheets that you were given. Pain Booklet, Coughing and Deep Breathing and Surgical Site Infection Prevention

## 2016-04-24 MED ORDER — CEFAZOLIN SODIUM-DEXTROSE 2-4 GM/100ML-% IV SOLN
2.0000 g | INTRAVENOUS | Status: AC
  Administered 2016-04-25: 2 g via INTRAVENOUS
  Filled 2016-04-24: qty 100

## 2016-04-25 ENCOUNTER — Ambulatory Visit (HOSPITAL_COMMUNITY): Payer: 59 | Admitting: Anesthesiology

## 2016-04-25 ENCOUNTER — Encounter (HOSPITAL_COMMUNITY)
Admission: RE | Disposition: A | Discharge status: Home / Self Care | Payer: Self-pay | Source: Ambulatory Visit | Attending: General Surgery

## 2016-04-25 ENCOUNTER — Encounter (HOSPITAL_COMMUNITY): Payer: Self-pay | Admitting: General Practice

## 2016-04-25 ENCOUNTER — Ambulatory Visit (HOSPITAL_COMMUNITY)
Admission: RE | Admit: 2016-04-25 | Discharge: 2016-04-25 | Disposition: A | Discharge status: Home / Self Care | Payer: 59 | Source: Ambulatory Visit | Attending: General Surgery | Admitting: General Surgery

## 2016-04-25 DIAGNOSIS — D171 Benign lipomatous neoplasm of skin and subcutaneous tissue of trunk: Secondary | ICD-10-CM | POA: Insufficient documentation

## 2016-04-25 DIAGNOSIS — K219 Gastro-esophageal reflux disease without esophagitis: Secondary | ICD-10-CM | POA: Insufficient documentation

## 2016-04-25 DIAGNOSIS — Z79899 Other long term (current) drug therapy: Secondary | ICD-10-CM | POA: Diagnosis not present

## 2016-04-25 HISTORY — PX: LIPOMA EXCISION: SHX5283

## 2016-04-25 SURGERY — EXCISION LIPOMA
Anesthesia: General | Site: Back

## 2016-04-25 MED ORDER — LACTATED RINGERS IV SOLN: INTRAVENOUS | Status: DC

## 2016-04-25 MED ORDER — MIDAZOLAM HCL 5 MG/5ML IJ SOLN
INTRAMUSCULAR | Status: DC | PRN
  Administered 2016-04-25: 2 mg via INTRAVENOUS

## 2016-04-25 MED ORDER — BUPIVACAINE-EPINEPHRINE 0.25% -1:200000 IJ SOLN
INTRAMUSCULAR | Status: DC | PRN
  Administered 2016-04-25: 14 mL

## 2016-04-25 MED ORDER — MIDAZOLAM HCL 2 MG/2ML IJ SOLN
INTRAMUSCULAR | Status: AC
  Filled 2016-04-25: qty 2

## 2016-04-25 MED ORDER — FENTANYL CITRATE (PF) 100 MCG/2ML IJ SOLN: 25.0000 ug | INTRAMUSCULAR | Status: DC | PRN

## 2016-04-25 MED ORDER — LIDOCAINE HCL (CARDIAC) 20 MG/ML IV SOLN
INTRAVENOUS | Status: DC | PRN
  Administered 2016-04-25: 100 mg via INTRAVENOUS

## 2016-04-25 MED ORDER — ACETAMINOPHEN 325 MG PO TABS: 650.0000 mg | ORAL_TABLET | ORAL | Status: DC | PRN

## 2016-04-25 MED ORDER — CHLORHEXIDINE GLUCONATE 4 % EX LIQD: 1.0000 "application " | Freq: Once | CUTANEOUS | Status: DC

## 2016-04-25 MED ORDER — METOCLOPRAMIDE HCL 5 MG/ML IJ SOLN: 10.0000 mg | Freq: Once | INTRAMUSCULAR | Status: DC | PRN

## 2016-04-25 MED ORDER — SODIUM CHLORIDE 0.9% FLUSH: 3.0000 mL | INTRAVENOUS | Status: DC | PRN

## 2016-04-25 MED ORDER — PROPOFOL 10 MG/ML IV BOLUS
INTRAVENOUS | Status: DC | PRN
  Administered 2016-04-25: 200 mg via INTRAVENOUS

## 2016-04-25 MED ORDER — OXYCODONE HCL 5 MG PO TABS
ORAL_TABLET | ORAL | Status: AC
  Filled 2016-04-25: qty 2

## 2016-04-25 MED ORDER — ONDANSETRON HCL 4 MG/2ML IJ SOLN
INTRAMUSCULAR | Status: DC | PRN
  Administered 2016-04-25: 4 mg via INTRAVENOUS

## 2016-04-25 MED ORDER — 0.9 % SODIUM CHLORIDE (POUR BTL) OPTIME
TOPICAL | Status: DC | PRN
  Administered 2016-04-25: 1000 mL

## 2016-04-25 MED ORDER — BUPIVACAINE-EPINEPHRINE (PF) 0.25% -1:200000 IJ SOLN
INTRAMUSCULAR | Status: AC
  Filled 2016-04-25: qty 30

## 2016-04-25 MED ORDER — MORPHINE SULFATE (PF) 2 MG/ML IV SOLN: 2.0000 mg | INTRAVENOUS | Status: DC | PRN

## 2016-04-25 MED ORDER — OXYCODONE-ACETAMINOPHEN 5-325 MG PO TABS: 1.0000 | ORAL_TABLET | ORAL | Status: DC | PRN

## 2016-04-25 MED ORDER — ACETAMINOPHEN 650 MG RE SUPP: 650.0000 mg | RECTAL | Status: DC | PRN

## 2016-04-25 MED ORDER — PROPOFOL 10 MG/ML IV BOLUS
INTRAVENOUS | Status: AC
  Filled 2016-04-25: qty 20

## 2016-04-25 MED ORDER — FENTANYL CITRATE (PF) 250 MCG/5ML IJ SOLN
INTRAMUSCULAR | Status: AC
  Filled 2016-04-25: qty 5

## 2016-04-25 MED ORDER — LACTATED RINGERS IV SOLN
INTRAVENOUS | Status: DC | PRN
  Administered 2016-04-25 (×2): via INTRAVENOUS

## 2016-04-25 MED ORDER — MEPERIDINE HCL 25 MG/ML IJ SOLN: 6.2500 mg | INTRAMUSCULAR | Status: DC | PRN

## 2016-04-25 MED ORDER — SODIUM CHLORIDE 0.9% FLUSH: 3.0000 mL | Freq: Two times a day (BID) | INTRAVENOUS | Status: DC

## 2016-04-25 MED ORDER — SODIUM CHLORIDE 0.9 % IV SOLN: 250.0000 mL | INTRAVENOUS | Status: DC | PRN

## 2016-04-25 MED ORDER — OXYCODONE HCL 5 MG PO TABS
5.0000 mg | ORAL_TABLET | ORAL | Status: DC | PRN
  Administered 2016-04-25: 10 mg via ORAL

## 2016-04-25 MED ORDER — FENTANYL CITRATE (PF) 100 MCG/2ML IJ SOLN
INTRAMUSCULAR | Status: DC | PRN
  Administered 2016-04-25: 50 ug via INTRAVENOUS

## 2016-04-25 SURGICAL SUPPLY — 40 items
BLADE SURG 10 STRL SS (BLADE) ×2 IMPLANT
BLADE SURG 15 STRL LF DISP TIS (BLADE) ×1 IMPLANT
BLADE SURG 15 STRL SS (BLADE) ×1
BLADE SURG ROTATE 9660 (MISCELLANEOUS) IMPLANT
CHLORAPREP W/TINT 26ML (MISCELLANEOUS) ×2 IMPLANT
COVER SURGICAL LIGHT HANDLE (MISCELLANEOUS) ×2 IMPLANT
DRAPE LAPAROTOMY T 98X78 PEDS (DRAPES) IMPLANT
DRAPE ORTHO SPLIT 77X108 STRL (DRAPES)
DRAPE SURG ORHT 6 SPLT 77X108 (DRAPES) IMPLANT
ELECT CAUTERY BLADE 6.4 (BLADE) ×2 IMPLANT
ELECT REM PT RETURN 9FT ADLT (ELECTROSURGICAL) ×2
ELECTRODE REM PT RTRN 9FT ADLT (ELECTROSURGICAL) ×1 IMPLANT
GAUZE SPONGE 4X4 12PLY STRL (GAUZE/BANDAGES/DRESSINGS) IMPLANT
GLOVE BIO SURGEON STRL SZ7 (GLOVE) ×2 IMPLANT
GLOVE BIO SURGEON STRL SZ7.5 (GLOVE) ×2 IMPLANT
GLOVE BIOGEL PI IND STRL 6.5 (GLOVE) ×1 IMPLANT
GLOVE BIOGEL PI IND STRL 7.0 (GLOVE) ×1 IMPLANT
GLOVE BIOGEL PI IND STRL 8 (GLOVE) ×1 IMPLANT
GLOVE BIOGEL PI INDICATOR 6.5 (GLOVE) ×1
GLOVE BIOGEL PI INDICATOR 7.0 (GLOVE) ×1
GLOVE BIOGEL PI INDICATOR 8 (GLOVE) ×1
GOWN STRL REUS W/ TWL LRG LVL3 (GOWN DISPOSABLE) ×1 IMPLANT
GOWN STRL REUS W/ TWL XL LVL3 (GOWN DISPOSABLE) ×1 IMPLANT
GOWN STRL REUS W/TWL LRG LVL3 (GOWN DISPOSABLE) ×1
GOWN STRL REUS W/TWL XL LVL3 (GOWN DISPOSABLE) ×1
KIT BASIN OR (CUSTOM PROCEDURE TRAY) ×2 IMPLANT
KIT ROOM TURNOVER OR (KITS) ×2 IMPLANT
LIQUID BAND (GAUZE/BANDAGES/DRESSINGS) ×2 IMPLANT
NS IRRIG 1000ML POUR BTL (IV SOLUTION) ×2 IMPLANT
PACK SURGICAL SETUP 50X90 (CUSTOM PROCEDURE TRAY) ×2 IMPLANT
PAD ARMBOARD 7.5X6 YLW CONV (MISCELLANEOUS) ×2 IMPLANT
PENCIL BUTTON HOLSTER BLD 10FT (ELECTRODE) ×2 IMPLANT
SPECIMEN JAR SMALL (MISCELLANEOUS) ×2 IMPLANT
SPONGE LAP 18X18 X RAY DECT (DISPOSABLE) ×2 IMPLANT
SUT MNCRL AB 4-0 PS2 18 (SUTURE) ×2 IMPLANT
SUT VIC AB 3-0 SH 18 (SUTURE) ×4 IMPLANT
SYR BULB 3OZ (MISCELLANEOUS) ×2 IMPLANT
TOWEL OR 17X24 6PK STRL BLUE (TOWEL DISPOSABLE) ×2 IMPLANT
TOWEL OR 17X26 10 PK STRL BLUE (TOWEL DISPOSABLE) ×2 IMPLANT
UNDERPAD 30X30 INCONTINENT (UNDERPADS AND DIAPERS) IMPLANT

## 2016-04-25 NOTE — Anesthesia Postprocedure Evaluation (Signed)
Anesthesia Post Note  Patient: Stacey Moore  Procedure(s) Performed: Procedure(s) (LRB): EXCISION LIPOMA (N/A)  Patient location during evaluation: PACU Anesthesia Type: General Level of consciousness: awake and alert Pain management: pain level controlled Vital Signs Assessment: post-procedure vital signs reviewed and stable Respiratory status: spontaneous breathing, nonlabored ventilation, respiratory function stable and patient connected to nasal cannula oxygen Cardiovascular status: blood pressure returned to baseline and stable Postop Assessment: no signs of nausea or vomiting Anesthetic complications: no    Last Vitals:  Filed Vitals:   04/25/16 0840 04/25/16 0850  BP:    Pulse: 80 77  Temp:    Resp:      Last Pain:  Filed Vitals:   04/25/16 0900  PainSc: 3                  Montez Hageman

## 2016-04-25 NOTE — Anesthesia Procedure Notes (Signed)
Procedure Name: LMA Insertion Date/Time: 04/25/2016 7:30 AM Performed by: Manuela Schwartz B Pre-anesthesia Checklist: Patient identified, Emergency Drugs available, Suction available, Patient being monitored and Timeout performed Patient Re-evaluated:Patient Re-evaluated prior to inductionOxygen Delivery Method: Circle system utilized Preoxygenation: Pre-oxygenation with 100% oxygen Intubation Type: IV induction LMA: LMA inserted LMA Size: 4.0 Number of attempts: 1 Placement Confirmation: positive ETCO2 and breath sounds checked- equal and bilateral Tube secured with: Tape Dental Injury: Teeth and Oropharynx as per pre-operative assessment

## 2016-04-25 NOTE — Transfer of Care (Signed)
Immediate Anesthesia Transfer of Care Note  Patient: Stacey Moore  Procedure(s) Performed: Procedure(s): EXCISION LIPOMA (N/A)  Patient Location: PACU  Anesthesia Type:General  Level of Consciousness: awake, alert  and oriented  Airway & Oxygen Therapy: Patient Spontanous Breathing  Post-op Assessment: Report given to RN and Post -op Vital signs reviewed and stable  Post vital signs: Reviewed and stable  Last Vitals:  Filed Vitals:   04/25/16 0601 04/25/16 0813  BP: 112/72 125/79  Pulse: 87 125  Temp: 36.9 C 36.7 C  Resp: 18     Last Pain: There were no vitals filed for this visit.    Patients Stated Pain Goal: 4 ([id number])  Complications: No apparent anesthesia complications

## 2016-04-25 NOTE — Op Note (Signed)
04/25/2016  8:05 AM  PATIENT:  Stacey Moore  35 y.o. female  PRE-OPERATIVE DIAGNOSIS:  Lipoma 7 x 6 cm  POST-OPERATIVE DIAGNOSIS:  Lipoma 7 x 6 cm  PROCEDURE:  Procedure(s): EXCISION LIPOMA (N/A)  SURGEON:  Surgeon(s) and Role:    * Ralene Ok, MD - Primary   ANESTHESIA:   local and general  EBL: <5cc Total I/O In: 1000 [I.V.:1000] Out: -   BLOOD ADMINISTERED:none  DRAINS: none   LOCAL MEDICATIONS USED:  BUPIVICAINE   SPECIMEN:  Source of Specimen:  7x6cm subcutaneous fatty mass  DISPOSITION OF SPECIMEN:  PATHOLOGY  COUNTS:  YES  TOURNIQUET:  * No tourniquets in log *  DICTATION: .Dragon Dictation After the patient was consented she was taken back to the operating room and placed in the supine position with bilateral SCDs in place. She underwent general endotracheal anesthesia. Patient was then placed in the right lateral decubitus position. Patient was prepped and draped in the sterile fashion. A timeout was called all facts verified.  A 5 cm incision was made over the area of the mass. Electrocautery was used to maintain hemostasis and dissection was taken down to the mass. The mass was Fatty in the subcutaneous space. This was circumferentially dissected away from surrounding tissue. Once the mass was dissected off the deep fascia this was delivered and measured. This measured approximately 7 x 6 cm in size.  At this time the area was irrigated out. Hemostasis was achieved. 3-0 Vicryl sutures were placed in interrupted fashion to reapproximate this space. The incision site was reapproximated using deep dermal stitches, 3-0 Vicryl's. The skin was reapproximated using 4-0 Monocryl subcuticular fashion. The skin was dressed with a LiquiBand. The incision site was infiltrated with quarter percent Marcaine with epi.  The patient tied the procedure well was taken to the recovery room in stable condition.    PLAN OF CARE: Discharge to home after PACU  PATIENT  DISPOSITION:  PACU - hemodynamically stable.   Delay start of Pharmacological VTE agent (>24hrs) due to surgical blood loss or risk of bleeding: not applicable

## 2016-04-25 NOTE — H&P (Signed)
History of Present Illness Stacey Ok MD; 08/20/2015 9:04 AM) The patient is a 35 year old female who presents with a complaint of Mass. Patient is a 35 year old female who is referred by Mackie Pai, PA for evaluation of back lipoma. Patient states is been there for several years. She states over the last year it's tripled in size. She states that it is give her some pain when she is lying down in when she is lifting. 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   Other Problems Elbert Ewings, CMA; 08/20/2015 8:54 AM) Gastroesophageal Reflux Disease  Past Surgical History Elbert Ewings, CMA; 08/20/2015 8:54 AM) Oral Surgery  Diagnostic Studies History Elbert Ewings, [place]; 08/20/2015 8:54 AM) Colonoscopy never Mammogram never Pap Smear 1-5 years ago  Allergies Elbert Ewings, CMA; 08/20/2015 8:54 AM) No Known Drug Allergies08/22/2016  Medication History Elbert Ewings, CMA; 08/20/2015 8:54 AM) No Current Medications Medications Reconciled  Social History Elbert Ewings, CMA; 08/20/2015 8:54 AM) Alcohol use Occasional alcohol use. Caffeine use Carbonated beverages, Coffee, Tea. No drug use Tobacco use Never smoker.  Family History Elbert Ewings, [place]; 08/20/2015 8:54 AM) Breast Cancer Family Members In General. Depression Mother. Heart Disease Family Members In General.  Pregnancy / Birth History Elbert Ewings, CMA; 08/20/2015 8:54 AM) Age at menarche 63 years. Contraceptive History Oral contraceptives. Gravida 0 Para 0 Regular periods    Review of Systems Elbert Ewings CMA; 08/20/2015 8:54 AM) General Not Present- Appetite Loss, Chills, Fatigue, Fever, Night Sweats, Weight Gain and Weight Loss. Skin Not Present- Change in Wart/Mole, Dryness, Hives, Jaundice, New Lesions, Non-Healing Wounds, Rash and Ulcer. HEENT Present- Wears glasses/contact lenses. Not Present- Earache, Hearing Loss, Hoarseness, Nose Bleed, Oral Ulcers, Ringing in the Ears, Seasonal  Allergies, Sinus Pain, Sore Throat, Visual Disturbances and Yellow Eyes. Respiratory Not Present- Bloody sputum, Chronic Cough, Difficulty Breathing, Snoring and Wheezing. Breast Not Present- Breast Mass, Breast Pain, Nipple Discharge and Skin Changes. Cardiovascular Not Present- Chest Pain, Difficulty Breathing Lying Down, Leg Cramps, Palpitations, Rapid Heart Rate, Shortness of Breath and Swelling of Extremities. Gastrointestinal Not Present- Abdominal Pain, Bloating, Bloody Stool, Change in Bowel Habits, Chronic diarrhea, Constipation, Difficulty Swallowing, Excessive gas, Gets full quickly at meals, Hemorrhoids, Indigestion, Nausea, Rectal Pain and Vomiting. Female Genitourinary Not Present- Frequency, Nocturia, Painful Urination, Pelvic Pain and Urgency. Musculoskeletal Not Present- Back Pain, Joint Pain, Joint Stiffness, Muscle Pain, Muscle Weakness and Swelling of Extremities. Neurological Not Present- Decreased Memory, Fainting, Headaches, Numbness, Seizures, Tingling, Tremor, Trouble walking and Weakness. Psychiatric Not Present- Anxiety, Bipolar, Change in Sleep Pattern, Depression, Fearful and Frequent crying. Endocrine Not Present- Cold Intolerance, Excessive Hunger, Hair Changes, Heat Intolerance, Hot flashes and New Diabetes. Hematology Not Present- Easy Bruising, Excessive bleeding, Gland problems, HIV and Persistent Infections. BP 112/72 mmHg  Pulse 87  Temp(Src) 98.5 F (36.9 C)  Resp 18  Ht 5\' 4"  (1.626 m)  Wt 71.668 kg (158 lb)  BMI 27.11 kg/m2  SpO2 97%  LMP 04/13/2016 (Approximate)   Physical Exam Stacey Ok MD; 08/20/2015 9:04 AM) General Mental Status-Alert. General Appearance-Consistent with stated age. Hydration-Well hydrated. Voice-Normal.  Integumentary Note: Left trapezius superficial lipoma approximately 8 x 6 cm in size in subcutaneous plane, soft, mobile   Chest and Lung Exam Chest and lung exam reveals -quiet, even and easy  respiratory effort with no use of accessory muscles and on auscultation, normal breath sounds, no adventitious sounds and normal vocal resonance. Inspection Chest Wall - Normal. Back - normal.  Cardiovascular  Cardiovascular examination reveals -normal heart sounds, regular rate and rhythm with no murmurs and normal pedal pulses bilaterally.  Abdomen Inspection Inspection of the abdomen reveals - No Hernias. Skin - Scar - no surgical scars. Palpation/Percussion Palpation and Percussion of the abdomen reveal - Soft, Non Tender, No Rebound tenderness, No Rigidity (guarding) and No hepatosplenomegaly. Auscultation Auscultation of the abdomen reveals - Bowel sounds normal.    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 Patient will like to proceed to the operating for excision of back mass. 2. Discussion of the risks benefits of the procedure to include but not limited to: Infection, bleeding, damages running shutters, possible recurrence. The patient was understanding and wishes to proceed.

## 2016-04-25 NOTE — Anesthesia Preprocedure Evaluation (Addendum)
Anesthesia Evaluation  Patient identified by MRN, date of birth, ID band Patient awake    Reviewed: Allergy & Precautions, NPO status , Patient's Chart, lab work & pertinent test results  Airway Mallampati: II  TM Distance: >3 FB Neck ROM: Full    Dental no notable dental hx.    Pulmonary neg pulmonary ROS,    Pulmonary exam normal breath sounds clear to auscultation       Cardiovascular negative cardio ROS Normal cardiovascular exam Rhythm:Regular Rate:Normal     Neuro/Psych negative neurological ROS  negative psych ROS   GI/Hepatic negative GI ROS, Neg liver ROS, GERD  Medicated and Controlled,  Endo/Other  negative endocrine ROS  Renal/GU negative Renal ROS  negative genitourinary   Musculoskeletal negative musculoskeletal ROS (+)   Abdominal   Peds negative pediatric ROS (+)  Hematology negative hematology ROS (+)   Anesthesia Other Findings   Reproductive/Obstetrics negative OB ROS                             Anesthesia Physical Anesthesia Plan  ASA: II  Anesthesia Plan: General   Post-op Pain Management:    Induction: Intravenous  Airway Management Planned: LMA and Oral ETT  Additional Equipment:   Intra-op Plan:   Post-operative Plan: Extubation in OR  Informed Consent: I have reviewed the patients History and Physical, chart, labs and discussed the procedure including the risks, benefits and alternatives for the proposed anesthesia with the patient or authorized representative who has indicated his/her understanding and acceptance.   Dental advisory given  Plan Discussed with: CRNA  Anesthesia Plan Comments:         Anesthesia Quick Evaluation

## 2016-04-28 ENCOUNTER — Encounter (HOSPITAL_COMMUNITY): Payer: Self-pay | Admitting: General Surgery

## 2016-07-04 ENCOUNTER — Encounter: Payer: Self-pay | Admitting: Nurse Practitioner

## 2016-07-04 ENCOUNTER — Ambulatory Visit: Payer: 59 | Admitting: Nurse Practitioner

## 2016-07-24 ENCOUNTER — Ambulatory Visit (INDEPENDENT_AMBULATORY_CARE_PROVIDER_SITE_OTHER): Payer: 59 | Admitting: Medical

## 2016-07-24 ENCOUNTER — Encounter: Payer: Self-pay | Admitting: Medical

## 2016-07-24 ENCOUNTER — Ambulatory Visit (HOSPITAL_BASED_OUTPATIENT_CLINIC_OR_DEPARTMENT_OTHER)
Admission: RE | Admit: 2016-07-24 | Discharge: 2016-07-24 | Disposition: A | Discharge status: Home / Self Care | Payer: 59 | Source: Ambulatory Visit | Attending: Medical | Admitting: Medical

## 2016-07-24 VITALS — BP 110/80 | HR 68 | Temp 98.2°F | Ht 64.0 in | Wt 157.5 lb

## 2016-07-24 DIAGNOSIS — M25552 Pain in left hip: Secondary | ICD-10-CM | POA: Insufficient documentation

## 2016-07-24 MED ORDER — DICLOFENAC SODIUM 75 MG PO TBEC: 75.0000 mg | DELAYED_RELEASE_TABLET | Freq: Two times a day (BID) | ORAL | 0 refills | Status: DC

## 2016-07-24 NOTE — Progress Notes (Signed)
Subjective:    Patient ID: Stacey Moore, female    DOB: September 25, 1981, 35 y.o.   MRN: [id number]  HPI   Pt in for evaluation of hip pain.  Pt states on and off hip pain. She states past 2 years often would occur after sitting for long periods of time. Also when lying in bed will feel constant ache. Pt states in past 2 years  pain would occur only 1-2 times in a month. But over past 2 months pain present only weekly. Maybe 2-3 days in row would hurt. No med for pain. Pain on left side hip. Pt not doing any exercise. Then states on and off running but not recently. Recently considered jogging again but did not want to start when had hip pain.  LMP- 3 weeks. Came at expected time and normal duration.   Review of Systems  Constitutional: Negative for chills, fatigue and fever.  Respiratory: Negative for cough and wheezing.   Cardiovascular: Negative for chest pain and palpitations.  Gastrointestinal: Negative for abdominal pain.  Genitourinary: Negative for pelvic pain.  Musculoskeletal: Negative for back pain.       Hip pain.  Skin: Negative for rash.  Hematological: Negative for adenopathy. Does not bruise/bleed easily.   Past Medical History:  Diagnosis Date  . Allergy   . GERD (gastroesophageal reflux disease)      Social History   Social History  . Marital status: Single    Spouse name: N/A  . Number of children: N/A  . Years of education: N/A   Occupational History  . Not on file.   Social History Main Topics  . Smoking status: Never Smoker  . Smokeless tobacco: Never Used  . Alcohol use 1.2 oz/week    1 Glasses of wine, 1 Cans of beer per week     Comment: socially couple of drinks. maybe 1-2 times a week.  . Drug use: No  . Sexual activity: Yes    Birth control/ protection: Condom     Comment: same sex relationship   Other Topics Concern  . Not on file   Social History Narrative  . No narrative on file    Past Surgical History:  Procedure  Laterality Date  . LIPOMA EXCISION N/A 04/25/2016   Procedure: EXCISION LIPOMA;  Surgeon: Ralene Ok, MD;  Location: [facility] OR;  Service: General;  Laterality: N/A;  . WISDOM TOOTH EXTRACTION      Family History  Problem Relation Age of Onset  . Hyperlipidemia Mother   . Cancer Maternal Grandfather     Lung  . Breast cancer Paternal Grandmother     No Known Allergies  Current Outpatient Prescriptions on File Prior to Visit  Medication Sig Dispense Refill  . omeprazole (PRILOSEC) 20 MG capsule Take 20 mg by mouth daily.    Marland Kitchen OVER THE COUNTER MEDICATION Take 2 tablets by mouth daily. "[employer]" for acid reflux or heart burn     No current facility-administered medications on file prior to visit.     BP 110/80 (BP Location: Left Arm, Patient Position: Sitting, Cuff Size: Normal)   Pulse 68   Temp 98.2 F (36.8 C) (Oral)   Ht 5\' 4"  (1.626 m)   Wt 157 lb 8 oz (71.4 kg)   BMI 27.03 kg/m       Objective:   Physical Exam  General- No acute distress. Pleasant patient.  Lungs- Clear, even and unlabored. Heart- regular rate and rhythm. Neurologic- CNII- XII grossly intact.  Lt hip- mid pain on rom and palpation.(also direct tender anterior superior iliac crest region.  Abdomen- soft, nt, nd,+bs, no rebound or guarding. No adenexal region pain on palpation       Assessment & Plan:  For your hip region pain will get xrays today and rx diclofenac for the pain. Since pain present for 2 yrs but recently worse will go ahead and refer to sports medicine.  Follow up in 2 weeks with or as needed.(if we get you in quickly with sports medicine then follow up with [place] won't be needed)  Mehar Sagen, Percell Miller, [employer]

## 2016-07-24 NOTE — Progress Notes (Signed)
Pre visit review using our clinic review tool, if applicable. No additional management support is needed unless otherwise documented below in the visit note. 

## 2016-07-24 NOTE — Patient Instructions (Addendum)
For your hip region pain will get xrays today and rx diclofenac for the pain. Since pain  present for 2 yrs but recently worse will go ahead and refer to sports medicine.  Follow up in 2 weeks with or as needed.(if we get you in quickly with sports medicine then follow up with [place] won't be needed)

## 2016-07-25 ENCOUNTER — Telehealth: Payer: Self-pay

## 2016-07-25 NOTE — Telephone Encounter (Signed)
-----   Message from Mackie Pai, PA-C sent at 07/24/2016  8:26 PM EDT ----- No acute abnormality of the hip. Put in referral to sports med.

## 2016-07-28 ENCOUNTER — Ambulatory Visit (INDEPENDENT_AMBULATORY_CARE_PROVIDER_SITE_OTHER): Payer: 59 | Admitting: Family Medicine

## 2016-07-28 ENCOUNTER — Encounter: Payer: Self-pay | Admitting: Family Medicine

## 2016-07-28 ENCOUNTER — Encounter (INDEPENDENT_AMBULATORY_CARE_PROVIDER_SITE_OTHER): Payer: Self-pay

## 2016-07-28 DIAGNOSIS — M25552 Pain in left hip: Secondary | ICD-10-CM

## 2016-07-28 NOTE — Patient Instructions (Signed)
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 Do stretches (quadriceps, lean forward with affected leg behind you) 3 sets of 20-30 seconds once or twice a day. Standing hip flexion, straight leg raises 3 sets of 10 once a day. Can add ankle weight if these become too easy. Can see handout for additional exercises. Compression shorts, ice/heat, medicines are unlikely to be beneficial. Activities as tolerated. If not improving would consider physical therapy as your next step. Follow up with me in 6 weeks.

## 2016-08-04 DIAGNOSIS — M25552 Pain in left hip: Secondary | ICD-10-CM | POA: Insufficient documentation

## 2016-08-04 NOTE — Assessment & Plan Note (Signed)
patient's exam is reassuring.  Independently reviewed radiographs and no evidence early arthritis, other bony abnormalities including impingement that would cause her pain.  Consistent with snapping hip syndrome.  She would like to start with home exercises and stretches that were shown today.  Other measures usually not very helpful.  Consider physical therapy if not improving.  F/u in 6 weeks.

## 2016-08-04 NOTE — Progress Notes (Signed)
PCP and consultation requested by: Mackie Pai, PA-C  Subjective:   HPI: Patient is a 35 y.o. female here for left hip pain.  Patient reports having off and on left hip pain for 2 years. More frequent last 2 months though. Pain is anterior, sharp, up to 10/10. Worse with changing positions. Pain doesn't last very long. Was limping at one time. No skin changes, numbness. No back pain.  Past Medical History:  Diagnosis Date  . Allergy   . GERD (gastroesophageal reflux disease)     Current Outpatient Prescriptions on File Prior to Visit  Medication Sig Dispense Refill  . diclofenac (VOLTAREN) 75 MG EC tablet Take 1 tablet (75 mg total) by mouth 2 (two) times daily. 30 tablet 0  . omeprazole (PRILOSEC) 20 MG capsule Take 20 mg by mouth daily.    Marland Kitchen OVER THE COUNTER MEDICATION Take 2 tablets by mouth daily. "[employer]" for acid reflux or heart burn     No current facility-administered medications on file prior to visit.     Past Surgical History:  Procedure Laterality Date  . LIPOMA EXCISION N/A 04/25/2016   Procedure: EXCISION LIPOMA;  Surgeon: Ralene Ok, MD;  Location: [place];  Service: General;  Laterality: N/A;  . WISDOM TOOTH EXTRACTION      No Known Allergies  Social History   Social History  . Marital status: Single    Spouse name: N/A  . Number of children: N/A  . Years of education: N/A   Occupational History  . Not on file.   Social History Main Topics  . Smoking status: Never Smoker  . Smokeless tobacco: Never Used  . Alcohol use 1.2 oz/week    1 Glasses of wine, 1 Cans of beer per week     Comment: socially couple of drinks. maybe 1-2 times a week.  . Drug use: No  . Sexual activity: Yes    Birth control/ protection: Condom     Comment: same sex relationship   Other Topics Concern  . Not on file   Social History Narrative  . No narrative on file    Family History  Problem Relation Age of Onset  . Hyperlipidemia Mother   . Cancer  Maternal Grandfather     Lung  . Breast cancer Paternal Grandmother     BP 109/76   Pulse 92   Ht 5\' 4"  (1.626 m)   Wt 158 lb (71.7 kg)   LMP 07/03/2016   BMI 27.12 kg/m   Review of Systems: See HPI above.    Objective:  Physical Exam:  Gen: NAD, comfortable in exam room  Back/left hip: No gross deformity, scoliosis. No TTP .  No midline or bony TTP. FROM without pain of back or hip. Strength LEs 5/5 all muscle groups.   2+ MSRs in patellar and achilles tendons, equal bilaterally. Negative SLRs. Sensation intact to light touch bilaterally. Negative logroll bilateral hips Negative fabers and piriformis stretches.    Assessment & Plan:  1. Left hip pain - patient's exam is reassuring.  Independently reviewed radiographs and no evidence early arthritis, other bony abnormalities including impingement that would cause her pain.  Consistent with snapping hip syndrome.  She would like to start with home exercises and stretches that were shown today.  Other measures usually not very helpful.  Consider physical therapy if not improving.  F/u in 6 weeks.

## 2016-09-08 ENCOUNTER — Ambulatory Visit: Payer: 59 | Admitting: Family Medicine

## 2016-10-17 ENCOUNTER — Ambulatory Visit (INDEPENDENT_AMBULATORY_CARE_PROVIDER_SITE_OTHER): Payer: 59 | Admitting: Nurse Practitioner

## 2016-10-17 ENCOUNTER — Encounter: Payer: Self-pay | Admitting: Nurse Practitioner

## 2016-10-17 VITALS — BP 116/74 | HR 60 | Ht 64.25 in | Wt 161.0 lb

## 2016-10-17 DIAGNOSIS — Z01419 Encounter for gynecological examination (general) (routine) without abnormal findings: Secondary | ICD-10-CM

## 2016-10-17 DIAGNOSIS — Z1151 Encounter for screening for human papillomavirus (HPV): Secondary | ICD-10-CM | POA: Diagnosis not present

## 2016-10-17 DIAGNOSIS — Z124 Encounter for screening for malignant neoplasm of cervix: Secondary | ICD-10-CM | POA: Diagnosis not present

## 2016-10-17 DIAGNOSIS — Z0000 Encounter for general adult medical examination without abnormal findings: Secondary | ICD-10-CM

## 2016-10-17 DIAGNOSIS — Z113 Encounter for screening for infections with a predominantly sexual mode of transmission: Secondary | ICD-10-CM | POA: Diagnosis not present

## 2016-10-17 LAB — POCT URINALYSIS DIPSTICK
Bilirubin, UA: NEGATIVE
Blood, UA: NEGATIVE
Glucose, UA: NEGATIVE
KETONES UA: NEGATIVE
LEUKOCYTES UA: NEGATIVE
Nitrite, UA: NEGATIVE
PH UA: 6
PROTEIN UA: NEGATIVE
Urobilinogen, UA: NEGATIVE

## 2016-10-17 NOTE — Progress Notes (Signed)
Patient ID: Stacey Moore, female   DOB: [birth year], 35 y.o.   MRN: [id number]  35 y.o. G0P0000 Single  Caucasian Fe here for annual exam.  Menses now at 3-5 days. Moderate to light, slight craps, some PMS.  Using condoms for birth control.  New partner this year who was a former partner 3-4 yrs ago.  He was from [place] and has now committed to moving here for her.   Lipoma remove from left upper back 04/25/16.  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  Patient's last menstrual period was 09/27/2016 (exact date).          Sexually active: Yes.    The current method of family planning is condoms most of the time.    Exercising: Patient does not have regular exercise routine. Smoker:  no  Health Maintenance: Pap: 07/04/15, negative with neg HR HPV TDaP: 12/30/07 HIV: 07/04/15 Labs: HB: Blood donation 2 weeks ago Urine: negative   reports that she has never smoked. She has never used smokeless tobacco. She reports that she drinks about 1.2 oz of alcohol per week . She reports that she does not use drugs.  Past Medical History:  Diagnosis Date  . Allergy   . GERD (gastroesophageal reflux disease)     Past Surgical History:  Procedure Laterality Date  . LIPOMA EXCISION N/A 04/25/2016   Procedure: EXCISION LIPOMA;  Surgeon: Ralene Ok, MD;  Location: [place];  Service: General;  Laterality: N/A;  . WISDOM TOOTH EXTRACTION      Current Outpatient Prescriptions  Medication Sig Dispense Refill  . omeprazole (PRILOSEC) 20 MG capsule Take 20 mg by mouth daily.    Marland Kitchen OVER THE COUNTER MEDICATION Take 2 tablets by mouth daily. "[employer]" for acid reflux or heart burn     No current facility-administered medications for this visit.     Family History  Problem Relation Age of Onset  . Hyperlipidemia Mother   . Cancer Maternal Grandfather     Lung  . Breast cancer Paternal Grandmother     ROS:  Pertinent items are noted in HPI.  Otherwise, a comprehensive ROS was  negative.  Exam:   BP 116/74 (BP Location: Right Arm, Patient Position: Sitting, Cuff Size: Normal)   Pulse 60   Ht 5' 4.25" (1.632 m)   Wt 161 lb (73 kg)   LMP 09/27/2016 (Exact Date)   BMI 27.42 kg/m  Height: 5' 4.25" (163.2 cm) Ht Readings from Last 3 Encounters:  10/17/16 5' 4.25" (1.632 m)  07/28/16 5\' 4"  (1.626 m)  07/24/16 5\' 4"  (1.626 m)    General appearance: alert, cooperative and appears stated age Head: Normocephalic, without obvious abnormality, atraumatic Neck: no adenopathy, supple, symmetrical, trachea midline and thyroid normal to inspection and palpation Lungs: clear to auscultation bilaterally Breasts: normal appearance, no masses or tenderness Heart: regular rate and rhythm Abdomen: soft, non-tender; no masses,  no organomegaly Extremities: extremities normal, atraumatic, no cyanosis or edema Skin: Skin color, texture, turgor normal. No rashes or lesions Lymph nodes: Cervical, supraclavicular, and axillary nodes normal. No abnormal inguinal nodes palpated Neurologic: Grossly normal   Pelvic: External genitalia:  no lesions              Urethra:  normal appearing urethra with no masses, tenderness or lesions              Bartholin's and Skene's: normal  Vagina: normal appearing vagina with normal color and discharge, no lesions              Cervix: anteverted              Pap taken: Yes.   Bimanual Exam:  Uterus:  normal size, contour, position, consistency, mobility, non-tender              Adnexa: no mass, fullness, tenderness               Rectovaginal: Confirms               Anus:  normal sphincter tone, no lesions  Chaperone present: yes  A:  Well Woman with normal exam  Condoms for contraception and happy with choice             R/O STD's with GC & Chl  S/P lipoma removed from left upper back    P:   Reviewed health and wellness pertinent to exam  Pap smear as above per request  Follow with pap and STD's  Counseled on breast  self exam, STD prevention, HIV risk factors and prevention, adequate intake of calcium and vitamin D, diet and exercise return annually or prn  An After Visit Summary was printed and given to the patient.

## 2016-10-17 NOTE — Patient Instructions (Signed)

## 2016-10-19 NOTE — Progress Notes (Signed)
Encounter reviewed by Dr. Brook Amundson C. Silva.  

## 2016-10-22 LAB — IPS N GONORRHOEA AND CHLAMYDIA BY PCR

## 2016-10-22 LAB — IPS PAP TEST WITH HPV

## 2016-11-05 ENCOUNTER — Encounter: Payer: Self-pay | Admitting: Certified Nurse Midwife

## 2016-11-05 ENCOUNTER — Ambulatory Visit (INDEPENDENT_AMBULATORY_CARE_PROVIDER_SITE_OTHER): Payer: 59 | Admitting: Certified Nurse Midwife

## 2016-11-05 VITALS — BP 110/80 | HR 88 | Temp 98.3°F | Resp 14 | Ht 64.25 in | Wt 167.0 lb

## 2016-11-05 DIAGNOSIS — B9689 Other specified bacterial agents as the cause of diseases classified elsewhere: Secondary | ICD-10-CM

## 2016-11-05 DIAGNOSIS — B373 Candidiasis of vulva and vagina: Secondary | ICD-10-CM

## 2016-11-05 DIAGNOSIS — B3731 Acute candidiasis of vulva and vagina: Secondary | ICD-10-CM

## 2016-11-05 DIAGNOSIS — N760 Acute vaginitis: Secondary | ICD-10-CM

## 2016-11-05 MED ORDER — FLUCONAZOLE 150 MG PO TABS: 150.0000 mg | ORAL_TABLET | Freq: Once | ORAL | 0 refills | Status: AC

## 2016-11-05 MED ORDER — METRONIDAZOLE 0.75 % VA GEL
1.0000 | Freq: Two times a day (BID) | VAGINAL | 0 refills | Status: DC
Start: 2016-11-05 — End: 2017-02-08

## 2016-11-05 NOTE — Progress Notes (Signed)
35 y.o. Single Caucasian female G0P0000 here with complaint of vaginal symptoms of itching, burning, and increase discharge. Describes discharge as noted as thick white, sticky, clings to outside of vulva area..Patient treated self with Monistat with some relief, but has increased again. Onset of symptoms 2 weeks ago. Denies new personal products. No STD concerns. Urinary symptoms none . Contraception is condoms. No other health issues today.   O:Healthy female WDWN Affect: normal, orientation x 3  Exam: Abdomen: soft, non  tender Inguinal Lymph nodes: no enlargement or tenderness Pelvic exam: External genital: normal female with increase pink and scaling noted on vulva  BUS: negative Vagina:white watery scant discharge noted. Ph: 4.5  ,Wet prep taken,  Cervix: normal, non tender, no CMT Uterus: normal, non tender Adnexa:normal, non tender, no masses or fullness noted   Wet Prep results:KOH, Saline positive for yeast, clue cells   A:Normal pelvic exam BV Yeast vaginitis/vulvitis   P:Discussed findings of yeast vaginitis/vulvitis/BV and etiology. Discussed Aveeno or baking soda sitz bath for comfort. Avoid moist clothes or pads for extended period of time. If working out in gym clothes  long periods of time change underwear   if possible. Coconut Oil use for skin protection prior to activity can be used to external skin for protection or dryness. Rx: Diflucan see order Rx :Metrogel see order with instructions  Rv prn

## 2016-11-05 NOTE — Patient Instructions (Signed)
Monilial Vaginitis Vaginitis in a soreness, swelling and redness (inflammation) of the vagina and vulva. Monilial vaginitis is not a sexually transmitted infection. CAUSES  Yeast vaginitis is caused by yeast (candida) that is normally found in your vagina. With a yeast infection, the candida has overgrown in number to a point that upsets the chemical balance. SYMPTOMS   White, thick vaginal discharge.  Swelling, itching, redness and irritation of the vagina and possibly the lips of the vagina (vulva).  Burning or painful urination.  Painful intercourse. DIAGNOSIS  Things that may contribute to monilial vaginitis are:  Postmenopausal and virginal states.  Pregnancy.  Infections.  Being tired, sick or stressed, especially if you had monilial vaginitis in the past.  Diabetes. Good control will help lower the chance.  Birth control pills.  Tight fitting garments.  Using bubble bath, feminine sprays, douches or deodorant tampons.  Taking certain medications that kill germs (antibiotics).  Sporadic recurrence can occur if you become ill. TREATMENT  Your caregiver will give you medication.  There are several kinds of anti monilial vaginal creams and suppositories specific for monilial vaginitis. For recurrent yeast infections, use a suppository or cream in the vagina 2 times a week, or as directed.  Anti-monilial or steroid cream for the itching or irritation of the vulva may also be used. Get your caregiver's permission.  Painting the vagina with methylene blue solution may help if the monilial cream does not work.  Eating yogurt may help prevent monilial vaginitis. HOME CARE INSTRUCTIONS   Finish all medication as prescribed.  Do not have sex until treatment is completed or after your caregiver tells you it is okay.  Take warm sitz baths.  Do not douche.  Do not use tampons, especially scented ones.  Wear cotton underwear.  Avoid tight pants and panty  hose.  Tell your sexual partner that you have a yeast infection. They should go to their caregiver if they have symptoms such as mild rash or itching.  Your sexual partner should be treated as well if your infection is difficult to eliminate.  Practice safer sex. Use condoms.  Some vaginal medications cause latex condoms to fail. Vaginal medications that harm condoms are:  Cleocin cream.  Butoconazole (Femstat).  Terconazole (Terazol) vaginal suppository.  Miconazole (Monistat) (may be purchased over the counter). SEEK MEDICAL CARE IF:   You have a temperature by mouth above 102 F (38.9 C).  The infection is getting worse after 2 days of treatment.  The infection is not getting better after 3 days of treatment.  You develop blisters in or around your vagina.  You develop vaginal bleeding, and it is not your menstrual period.  You have pain when you urinate.  You develop intestinal problems.  You have pain with sexual intercourse.   This information is not intended to replace advice given to you by your health care provider. Make sure you discuss any questions you have with your health care provider.   Document Released: 09/24/2005 Document Revised: 03/08/2012 Document Reviewed: 06/18/2015 Elsevier Interactive Patient Education 2016 Elsevier Inc. Bacterial Vaginosis Bacterial vaginosis is a vaginal infection that occurs when the normal balance of bacteria in the vagina is disrupted. It results from an overgrowth of certain bacteria. This is the most common vaginal infection in women of childbearing age. Treatment is important to prevent complications, especially in pregnant women, as it can cause a premature delivery. CAUSES  Bacterial vaginosis is caused by an increase in harmful bacteria that are normally present  in smaller amounts in the vagina. Several different kinds of bacteria can cause bacterial vaginosis. However, the reason that the condition develops is not  fully understood. RISK FACTORS Certain activities or behaviors can put you at an increased risk of developing bacterial vaginosis, including:  Having a new sex partner or multiple sex partners.  Douching.  Using an intrauterine device (IUD) for contraception. Women do not get bacterial vaginosis from toilet seats, bedding, swimming pools, or contact with objects around them. SIGNS AND SYMPTOMS  Some women with bacterial vaginosis have no signs or symptoms. Common symptoms include:  Grey vaginal discharge.  A fishlike odor with discharge, especially after sexual intercourse.  Itching or burning of the vagina and vulva.  Burning or pain with urination. DIAGNOSIS  Your health care provider will take a medical history and examine the vagina for signs of bacterial vaginosis. A sample of vaginal fluid may be taken. Your health care provider will look at this sample under a microscope to check for bacteria and abnormal cells. A vaginal pH test may also be done.  TREATMENT  Bacterial vaginosis may be treated with antibiotic medicines. These may be given in the form of a pill or a vaginal cream. A second round of antibiotics may be prescribed if the condition comes back after treatment. Because bacterial vaginosis increases your risk for sexually transmitted diseases, getting treated can help reduce your risk for chlamydia, gonorrhea, HIV, and herpes. HOME CARE INSTRUCTIONS   Only take over-the-counter or prescription medicines as directed by your health care provider.  If antibiotic medicine was prescribed, take it as directed. Make sure you finish it even if you start to feel better.  Tell all sexual partners that you have a vaginal infection. They should see their health care provider and be treated if they have problems, such as a mild rash or itching.  During treatment, it is important that you follow these instructions:  Avoid sexual activity or use condoms correctly.  Do not  douche.  Avoid alcohol as directed by your health care provider.  Avoid breastfeeding as directed by your health care provider. SEEK MEDICAL CARE IF:   Your symptoms are not improving after 3 days of treatment.  You have increased discharge or pain.  You have a fever. MAKE SURE YOU:   Understand these instructions.  Will watch your condition.  Will get help right away if you are not doing well or get worse. FOR MORE INFORMATION  Centers for Disease Control and Prevention, Division of STD Prevention: AppraiserFraud.fi American Sexual Health Association (ASHA): www.ashastd.org    This information is not intended to replace advice given to you by your health care provider. Make sure you discuss any questions you have with your health care provider.   Document Released: 12/15/2005 Document Revised: 01/05/2015 Document Reviewed: 07/27/2013 Elsevier Interactive Patient Education [employer].

## 2016-11-09 NOTE — Progress Notes (Signed)
Encounter reviewed Hillel Card, MD   

## 2017-02-08 ENCOUNTER — Ambulatory Visit (HOSPITAL_COMMUNITY)
Admission: EM | Admit: 2017-02-08 | Discharge: 2017-02-08 | Disposition: A | Discharge status: Home / Self Care | Payer: 59 | Attending: Internal Medicine | Admitting: Internal Medicine

## 2017-02-08 ENCOUNTER — Encounter (HOSPITAL_COMMUNITY): Payer: Self-pay | Admitting: Emergency Medicine

## 2017-02-08 DIAGNOSIS — B349 Viral infection, unspecified: Secondary | ICD-10-CM

## 2017-02-08 DIAGNOSIS — R0981 Nasal congestion: Secondary | ICD-10-CM

## 2017-02-08 MED ORDER — PREDNISONE 50 MG PO TABS: 50.0000 mg | ORAL_TABLET | Freq: Every day | ORAL | 0 refills | Status: DC

## 2017-02-08 MED ORDER — AMOXICILLIN-POT CLAVULANATE 875-125 MG PO TABS: 1.0000 | ORAL_TABLET | Freq: Two times a day (BID) | ORAL | 0 refills | Status: AC

## 2017-02-08 NOTE — Discharge Instructions (Addendum)
Rest, push fluids.  Rx prednisone for congestion, take this first.  If symptoms not improving, can start amoxicillin/clavulanate (antibiotic) when you have been sick for closer to 10 days.  Need to give this a chance to go away first.  Recheck for fever >100.5, increasing phlegm production/nasal discharge, or if not starting to improve in several days.

## 2017-02-08 NOTE — ED Provider Notes (Signed)
[facility]    CSN: [id number] Arrival date & time: 02/08/17  1659     History   Chief Complaint Chief Complaint  Patient presents with  . Facial Pain    HPI Stacey Moore is a 36 y.o. female. She is a Marine scientist at [employer]. She presents today with a history of headache and sinus congestion/pressure, feels terrible. Little bit of sore throat, some dry cough at bedtime. No vomiting, no diarrhea. Not febrile. Not achy. Taking usually managed sinus symptoms with Aleve D and Mucinex, but they're not helping now. No sinus infection for about the last 10 years. Dislikes nasal steroid sprays. Called out of work today.    HPI  Past Medical History:  Diagnosis Date  . Allergy   . GERD (gastroesophageal reflux disease)     Patient Active Problem List   Diagnosis Date Noted  . Left hip pain 08/04/2016  . 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 GERD (gastroesophageal reflux disease) 07/23/2015  . Allergic rhinitis 07/23/2015    Past Surgical History:  Procedure Laterality Date  . LIPOMA EXCISION N/A 04/25/2016   Procedure: EXCISION LIPOMA;  Surgeon: Ralene Ok, MD;  Location: [facility];  Service: General;  Laterality: N/A;  . WISDOM TOOTH EXTRACTION      OB History    Gravida Para Term Preterm AB Living   0 0 0 0 0 0   SAB TAB Ectopic Multiple Live Births   0 0 0 0 0       Home Medications    Prior to Admission medications   Medication Sig Start Date End Date Taking? Authorizing Provider  amoxicillin-clavulanate (AUGMENTIN) 875-125 MG tablet Take 1 tablet by mouth 2 (two) times daily. 02/08/17 02/18/17  Sherlene Shams, MD  predniSONE (DELTASONE) 50 MG tablet Take 1 tablet (50 mg total) by mouth daily. 02/08/17   Sherlene Shams, MD    Family History Family History  Problem Relation Age of Onset  . Hyperlipidemia Mother   . Cancer Maternal Grandfather     Lung  . Breast cancer Paternal Grandmother     Social History Social History  Substance Use Topics  . Smoking status:  Never Smoker  . Smokeless tobacco: Never Used  . Alcohol use 1.2 oz/week    1 Glasses of wine, 1 Cans of beer per week     Comment: socially couple of drinks. maybe 1-2 times a week.     Allergies   Patient has no known allergies.   Review of Systems Review of Systems  All other systems reviewed and are negative.    Physical Exam Triage Vital Signs ED Triage Vitals  Enc Vitals Group     BP 02/08/17 1817 121/83     Pulse Rate 02/08/17 1817 86     Resp 02/08/17 1817 18     Temp 02/08/17 1817 98.2 F (36.8 C)     Temp Source 02/08/17 1817 Oral     SpO2 02/08/17 1817 100 %     Weight --      Height --      Pain Score 02/08/17 1816 5   Updated Vital Signs BP 121/83 (BP Location: Right Arm)   Pulse 86   Temp 98.2 F (36.8 C) (Oral)   Resp 18   SpO2 100%   Physical Exam  Constitutional: She is oriented to person, place, and time. No distress.  Alert, nicely groomed Sitting up on the end of the exam table  HENT:  Head: Atraumatic.  Bilateral TMs are moderately dull, no erythema Marked nasal congestion bilaterally Throat is pink, minimal injection  Eyes:  Conjugate gaze, no eye redness/drainage  Neck: Neck supple.  Cardiovascular: Normal rate and regular rhythm.   Pulmonary/Chest: No respiratory distress. She has no wheezes. She has no rales.  Lungs clear, symmetric breath sounds  Abdominal: She exhibits no distension.  Musculoskeletal: Normal range of motion.  No leg swelling  Neurological: She is alert and oriented to person, place, and time.  Skin: Skin is warm and dry.  No cyanosis  Nursing note and vitals reviewed.    UC Treatments / Results   Procedures Procedures (including critical care time) None today  Final Clinical Impressions(s) / UC Diagnoses   Final diagnoses:  Sinus congestion  Nonspecific syndrome suggestive of viral illness   Rest, push fluids.  Rx prednisone for congestion, take this first.  If symptoms not improving, can start  amoxicillin/clavulanate (antibiotic) when you have been sick for closer to 10 days.  Need to give this a chance to go away first.  Recheck for fever >100.5, increasing phlegm production/nasal discharge, or if not starting to improve in several days.    New Prescriptions New Prescriptions   AMOXICILLIN-CLAVULANATE (AUGMENTIN) 875-125 MG TABLET    Take 1 tablet by mouth 2 (two) times daily.   PREDNISONE (DELTASONE) 50 MG TABLET    Take 1 tablet (50 mg total) by mouth daily.     Sherlene Shams, MD 02/09/17 1249

## 2017-02-08 NOTE — ED Triage Notes (Signed)
The patient presented to the [facility] with a complaint of sinus pain and pressure x 2 days. The patient reported using OTC meds with no relief.

## 2017-07-21 ENCOUNTER — Telehealth: Payer: Self-pay | Admitting: Obstetrics & Gynecology

## 2017-07-21 NOTE — Telephone Encounter (Signed)
Left message for patient to reschedule Patty Grubb appt. °

## 2017-10-19 DIAGNOSIS — J111 Influenza due to unidentified influenza virus with other respiratory manifestations: Secondary | ICD-10-CM | POA: Diagnosis not present

## 2017-10-21 ENCOUNTER — Ambulatory Visit: Payer: 59 | Admitting: Nurse Practitioner

## 2017-11-06 ENCOUNTER — Encounter: Payer: Self-pay | Admitting: Family Medicine

## 2017-11-06 ENCOUNTER — Ambulatory Visit (INDEPENDENT_AMBULATORY_CARE_PROVIDER_SITE_OTHER): Payer: 59 | Admitting: Family Medicine

## 2017-11-06 VITALS — BP 114/78 | HR 81 | Temp 98.2°F | Ht 64.0 in | Wt 162.0 lb

## 2017-11-06 DIAGNOSIS — L989 Disorder of the skin and subcutaneous tissue, unspecified: Secondary | ICD-10-CM

## 2017-11-06 NOTE — Progress Notes (Signed)
Pre visit review using our clinic review tool, if applicable. No additional management support is needed unless otherwise documented below in the visit note. 

## 2017-11-06 NOTE — Patient Instructions (Addendum)
If you do not hear anything about your referral in the next 1-2 weeks, call our office and ask for an update.  If any signs of infection arise, seek care or let us know.   Let us know if you need anything.

## 2017-11-06 NOTE — Progress Notes (Signed)
Chief Complaint  Patient presents with  . Nevus    left foot    Stacey Moore is a 36 y.o. female here for a skin complaint.  Duration: As long as she can remember, but started getting bigger and raised up over the past few weeks.  Location: bottom of L foot Pruritic? No Painful? No Drainage? No New soaps/lotions/topicals/detergents? No Other associated symptoms: none Therapies tried thus far: nothing  ROS:  Skin: As noted in HPI  Past Medical History:  Diagnosis Date  . Allergy   . GERD (gastroesophageal reflux disease)    No Known Allergies  BP 114/78 (BP Location: Left Arm, Patient Position: Sitting, Cuff Size: Normal)   Pulse 81   Temp 98.2 F (36.8 C) (Oral)   Ht 5\' 4"  (1.626 m)   Wt 162 lb (73.5 kg)   SpO2 97%   BMI 27.81 kg/m  Gen: awake, alert, appearing stated age Lungs: No accessory muscle use Skin: See below. 0.8 cm x 0.5 cm. No drainage, erythema, TTP, fluctuance, excoriation Psych: Age appropriate judgment and insight   Media Information    Skin lesion - Plan: Ambulatory referral to Dermatology  Orders as above. Offered to keep an eye on things vs take it off here.  F/u prn. The patient voiced understanding and agreement to the plan.  [place], DO 11/06/17 8:45 AM

## 2017-12-30 ENCOUNTER — Ambulatory Visit (INDEPENDENT_AMBULATORY_CARE_PROVIDER_SITE_OTHER): Payer: 59 | Admitting: Obstetrics & Gynecology

## 2017-12-30 ENCOUNTER — Encounter: Payer: Self-pay | Admitting: Obstetrics & Gynecology

## 2017-12-30 ENCOUNTER — Other Ambulatory Visit: Payer: Self-pay

## 2017-12-30 ENCOUNTER — Other Ambulatory Visit (HOSPITAL_COMMUNITY)
Admission: RE | Admit: 2017-12-30 | Discharge: 2017-12-30 | Disposition: A | Discharge status: Home / Self Care | Payer: 59 | Source: Ambulatory Visit | Attending: Obstetrics & Gynecology | Admitting: Obstetrics & Gynecology

## 2017-12-30 VITALS — BP 120/76 | HR 84 | Resp 16 | Ht 64.5 in | Wt 156.0 lb

## 2017-12-30 DIAGNOSIS — Z124 Encounter for screening for malignant neoplasm of cervix: Secondary | ICD-10-CM | POA: Insufficient documentation

## 2017-12-30 DIAGNOSIS — N889 Noninflammatory disorder of cervix uteri, unspecified: Secondary | ICD-10-CM

## 2017-12-30 DIAGNOSIS — N888 Other specified noninflammatory disorders of cervix uteri: Secondary | ICD-10-CM | POA: Diagnosis not present

## 2017-12-30 DIAGNOSIS — N852 Hypertrophy of uterus: Secondary | ICD-10-CM | POA: Diagnosis not present

## 2017-12-30 DIAGNOSIS — Z01419 Encounter for gynecological examination (general) (routine) without abnormal findings: Secondary | ICD-10-CM

## 2017-12-30 DIAGNOSIS — N898 Other specified noninflammatory disorders of vagina: Secondary | ICD-10-CM

## 2017-12-30 NOTE — Progress Notes (Signed)
Patient scheduled while in office for PUS on 01/19/18 at 3:30pm with consult to follow at 4pm with Dr. Sabra Heck. Patient declined earlier appt d/t work schedule. Patient verbalizes understanding and is agreeable.

## 2017-12-30 NOTE — Progress Notes (Signed)
37 y.o. G0P0000 SingleCaucasianF here for annual exam.  Cycles are regular.  Uses condoms for contraception but has issues with recurrent yeast infections.  Does not want to use anything hormonal for contraception.  Patient's last menstrual period was 12/13/2017 (exact date).          Sexually active: Yes.    The current method of family planning is condoms sometimes.    Exercising: No.   Smoker:  no  Health Maintenance: Pap:  10/17/16 Neg. HR HPV:Neg   07/04/15 Neg. HR HPV:neg  History of abnormal Pap:  no MMG:  Never TDaP:  2009  Screening Labs: PCP   reports that  has never smoked. she has never used smokeless tobacco. She reports that she drinks about 1.2 oz of alcohol per week. She reports that she does not use drugs.  Past Medical History:  Diagnosis Date  . Allergy   . GERD (gastroesophageal reflux disease)     Past Surgical History:  Procedure Laterality Date  . LIPOMA EXCISION N/A 04/25/2016   Procedure: EXCISION LIPOMA;  Surgeon: Ralene Ok, MD;  Location: [facility];  Service: General;  Laterality: N/A;  . WISDOM TOOTH EXTRACTION      Current Outpatient Medications  Medication Sig Dispense Refill  . omeprazole (PRILOSEC) 20 MG capsule Take 20 mg by mouth daily.     No current facility-administered medications for this visit.     Family History  Problem Relation Age of Onset  . Hyperlipidemia Mother   . Cancer Maternal Grandfather        Lung  . Breast cancer Paternal Grandmother     ROS:  Pertinent items are noted in HPI.  Otherwise, a comprehensive ROS was negative.  Exam:   BP 120/76 (BP Location: Right Arm, Patient Position: Sitting, Cuff Size: Normal)   Pulse 84   Resp 16   Ht 5' 4.5" (1.638 m)   Wt 156 lb (70.8 kg)   LMP 12/13/2017 (Exact Date)   BMI 26.36 kg/m    Height: 5' 4.5" (163.8 cm)  Ht Readings from Last 3 Encounters:  12/30/17 5' 4.5" (1.638 m)  11/06/17 5\' 4"  (1.626 m)  11/05/16 5' 4.25" (1.632 m)    General appearance: alert,  cooperative and appears stated age Head: Normocephalic, without obvious abnormality, atraumatic Neck: no adenopathy, supple, symmetrical, trachea midline and thyroid normal to inspection and palpation Lungs: clear to auscultation bilaterally Breasts: normal appearance, no masses or tenderness Heart: regular rate and rhythm Abdomen: soft, non-tender; bowel sounds normal; no masses,  no organomegaly Extremities: extremities normal, atraumatic, no cyanosis or edema Skin: Skin color, texture, turgor normal. No rashes or lesions Lymph nodes: Cervical, supraclavicular, and axillary nodes normal. No abnormal inguinal nodes palpated Neurologic: Grossly normal   Pelvic: External genitalia:  no lesions              Urethra:  normal appearing urethra with no masses, tenderness or lesions              Bartholins and Skenes: normal                 Vagina: normal appearing vagina with normal color and discharge, no lesions              Cervix: purplish lesion on anterior aspect of cervix, 12 o'clock.  Biopsy recommended.              Pap taken: yes (per pt request) Bimanual Exam:  Uterus:  Mobile but enlarged on  right side, possible fibroid vs enlarged right ovary.              Adnexa: normal adnexa and no mass, fullness, tenderness               Rectovaginal: Confirms               Anus:  normal sphincter tone, no lesions  Procedure:  Speculum placed.  Cervix cleansed with Betadine x 3.  Anterior lip of cervix biopsied with biopsy forceps.  Sent to pathology.  Minimal bleeding noted.  Silver nitrate used for excellent hemostasis.    Chaperone was present for exam.  A:  Well Woman with normal exam GERD SA, needs STD testing today Uses condoms for contraception Purplish cervical lesion, biopsied today Enlarged right side of uterus vs adnexal mass  P:   Mammogram guidelines reviewed.  Does have family hx of breast cancer in [place].   pap smear obtained today.  Desires yearly pap smear.  Did review  guidelines.  No HR HPV will be obtained today. Pathology pending for cervical biopsy. Ultrasound ordered.  Pt will return for this. Return annually or prn

## 2018-01-01 LAB — NUSWAB VAGINITIS PLUS (VG+)
CANDIDA GLABRATA, NAA: NEGATIVE
Candida albicans, NAA: NEGATIVE
Chlamydia trachomatis, NAA: NEGATIVE
NEISSERIA GONORRHOEAE, NAA: NEGATIVE
TRICH VAG BY NAA: NEGATIVE

## 2018-01-04 DIAGNOSIS — D1800 Hemangioma unspecified site: Secondary | ICD-10-CM | POA: Diagnosis not present

## 2018-01-04 DIAGNOSIS — Z23 Encounter for immunization: Secondary | ICD-10-CM | POA: Diagnosis not present

## 2018-01-04 DIAGNOSIS — D2272 Melanocytic nevi of left lower limb, including hip: Secondary | ICD-10-CM | POA: Diagnosis not present

## 2018-01-04 LAB — CYTOLOGY - PAP: DIAGNOSIS: NEGATIVE

## 2018-01-13 ENCOUNTER — Telehealth: Payer: Self-pay | Admitting: Obstetrics & Gynecology

## 2018-01-13 NOTE — Telephone Encounter (Signed)
Call  Placed to patient to review benefits for scheduled ultrasound. Left Voicemail requesting a return call.

## 2018-01-13 NOTE — Telephone Encounter (Signed)
Patient returning Suzy's call. °

## 2018-01-13 NOTE — Telephone Encounter (Signed)
Returned call to patient. Reviewed benefit for ultrasound scheduled on 01/19/18.  Patient understood and agreeable. Appointment re-scheduled to 01/21/18 with Dr Sabra Heck, at patient's request. Patient is aware of appointment date, arrival time and  cancellation policy.  No further questions. Ok to close.  Routing to Dr Sabra Heck for final review

## 2018-01-19 ENCOUNTER — Other Ambulatory Visit: Payer: Self-pay | Admitting: Obstetrics & Gynecology

## 2018-01-19 ENCOUNTER — Other Ambulatory Visit: Payer: Self-pay

## 2018-01-21 ENCOUNTER — Other Ambulatory Visit: Payer: Self-pay

## 2018-01-21 ENCOUNTER — Ambulatory Visit (INDEPENDENT_AMBULATORY_CARE_PROVIDER_SITE_OTHER): Payer: 59 | Admitting: Obstetrics & Gynecology

## 2018-01-21 ENCOUNTER — Ambulatory Visit (INDEPENDENT_AMBULATORY_CARE_PROVIDER_SITE_OTHER): Payer: 59

## 2018-01-21 ENCOUNTER — Encounter: Payer: Self-pay | Admitting: Obstetrics & Gynecology

## 2018-01-21 VITALS — BP 102/60 | HR 88 | Resp 16 | Wt 156.0 lb

## 2018-01-21 DIAGNOSIS — D251 Intramural leiomyoma of uterus: Secondary | ICD-10-CM | POA: Diagnosis not present

## 2018-01-21 DIAGNOSIS — N852 Hypertrophy of uterus: Secondary | ICD-10-CM

## 2018-01-21 NOTE — Progress Notes (Signed)
37 y.o. G0P0000 Single Caucasian female here for pelvic ultrasound due to enlarged uterus noted on physical exam on 12/30/17.  Cycles are regular.  Pt does not want to use any hormonal methods for cycle control at this time.  Patient's last menstrual period was 01/11/2018 (within days).  Contraception: condoms  Findings:  UTERUS: 9.0 x 5.8 x 6.4cm with 3.0 x 4.8 x 4.8cm intramural and off to right fibroid EMS: 5.52mm ADNEXA: Left ovary: 3.4 x 2.4 x 2.4cm with 2.0 x 9.8XQ follicle noted       Right ovary:  3.0 x 1.7 x 2.3cm CUL DE SAC: no free fluid  Discussion:  Findings reviewed.  Fibroid noted.  Pt's cycles are still normal.  Not actively trying for pregnancy.  Risks of miscarriage with pregnancy reviewed.  As she is really not symptomatic, I feel ok to watch and monitor with physical exams.  Pt knows to call with any changes in cycles, changes in pelvic pain or pressure or other new issues.  [place] bulletin on fibroids provided.  Assessment:  Enlarged uterus with 5cm intramural fibroid  Plan:  Pt will call with any changes in cycle or with any new GI or gyn  Will recheck with physical exam at [facility].  ~15 minutes spent with patient >50% of time was in face to face discussion of above.

## 2018-04-23 ENCOUNTER — Ambulatory Visit (INDEPENDENT_AMBULATORY_CARE_PROVIDER_SITE_OTHER): Payer: 59 | Admitting: Medical

## 2018-04-23 ENCOUNTER — Encounter: Payer: Self-pay | Admitting: Medical

## 2018-04-23 VITALS — BP 128/78 | HR 86 | Temp 98.2°F | Resp 16 | Ht 64.0 in | Wt 162.0 lb

## 2018-04-23 DIAGNOSIS — Z0000 Encounter for general adult medical examination without abnormal findings: Secondary | ICD-10-CM

## 2018-04-23 DIAGNOSIS — Z23 Encounter for immunization: Secondary | ICD-10-CM

## 2018-04-23 LAB — LIPID PANEL
CHOLESTEROL: 138 mg/dL (ref 0–200)
HDL: 55.7 mg/dL (ref >39.00)
LDL Cholesterol: 72 mg/dL (ref 0–99)
NonHDL: 81.96
Total CHOL/HDL Ratio: 2
Triglycerides: 51 mg/dL (ref 0.0–149.0)
VLDL: 10.2 mg/dL (ref 0.0–40.0)

## 2018-04-23 LAB — COMPREHENSIVE METABOLIC PANEL
ALT: 15 U/L (ref 0–35)
AST: 17 U/L (ref 0–37)
Albumin: 4.3 g/dL (ref 3.5–5.2)
Alkaline Phosphatase: 72 U/L (ref 39–117)
BUN: 14 mg/dL (ref 6–23)
CO2: 27 mEq/L (ref 19–32)
CREATININE: 0.92 mg/dL (ref 0.40–1.20)
Calcium: 9.6 mg/dL (ref 8.4–10.5)
Chloride: 106 mEq/L (ref 96–112)
GFR: 73.97 mL/min (ref >60.00)
Glucose, Bld: 81 mg/dL (ref 70–99)
POTASSIUM: 4.5 meq/L (ref 3.5–5.1)
SODIUM: 142 meq/L (ref 135–145)
TOTAL PROTEIN: 7 g/dL (ref 6.0–8.3)
Total Bilirubin: 0.6 mg/dL (ref 0.2–1.2)

## 2018-04-23 LAB — URINALYSIS, ROUTINE W REFLEX MICROSCOPIC
BILIRUBIN URINE: NEGATIVE
HGB URINE DIPSTICK: NEGATIVE
Ketones, ur: NEGATIVE
LEUKOCYTES UA: NEGATIVE
NITRITE: NEGATIVE
RBC / HPF: NONE SEEN (ref 0–?)
Specific Gravity, Urine: <=1.005 — AB (ref 1.000–1.030)
Total Protein, Urine: NEGATIVE
Urine Glucose: NEGATIVE
Urobilinogen, UA: 0.2 (ref 0.0–1.0)
pH: 7 (ref 5.0–8.0)

## 2018-04-23 LAB — CBC WITH DIFFERENTIAL/PLATELET
Basophils Absolute: 0 10*3/uL (ref 0.0–0.1)
Basophils Relative: 0.4 % (ref 0.0–3.0)
EOS PCT: 1.9 % (ref 0.0–5.0)
Eosinophils Absolute: 0.2 10*3/uL (ref 0.0–0.7)
HCT: 40.6 % (ref 36.0–46.0)
Hemoglobin: 13.6 g/dL (ref 12.0–15.0)
Lymphocytes Relative: 22.3 % (ref 12.0–46.0)
Lymphs Abs: 1.9 10*3/uL (ref 0.7–4.0)
MCHC: 33.4 g/dL (ref 30.0–36.0)
MCV: 99.5 fl (ref 78.0–100.0)
MONO ABS: 0.5 10*3/uL (ref 0.1–1.0)
MONOS PCT: 5.4 % (ref 3.0–12.0)
Neutro Abs: 5.9 10*3/uL (ref 1.4–7.7)
Neutrophils Relative %: 70 % (ref 43.0–77.0)
Platelets: 291 10*3/uL (ref 150.0–400.0)
RBC: 4.08 Mil/uL (ref 3.87–5.11)
RDW: 13.2 % (ref 11.5–15.5)
WBC: 8.4 10*3/uL (ref 4.0–10.5)

## 2018-04-23 NOTE — Patient Instructions (Addendum)
For you wellness exam today I have ordered cbc, cmp, lipid panel, and ua  Vaccine given today tdap  Recommend exercise and healthy diet.  We will let you know lab results as they come in.  Follow up date appointment will be determined after lab review.    Preventive Care 18-39 Years, Female Preventive care refers to lifestyle choices and visits with your health care provider that can promote health and wellness. What does preventive care include?  A yearly physical exam. This is also called an annual well check.  Dental exams once or twice a year.  Routine eye exams. Ask your health care provider how often you should have your eyes checked.  Personal lifestyle choices, including: ? Daily care of your teeth and gums. ? Regular physical activity. ? Eating a healthy diet. ? Avoiding tobacco and drug use. ? Limiting alcohol use. ? Practicing safe sex. ? Taking vitamin and mineral supplements as recommended by your health care provider. What happens during an annual well check? The services and screenings done by your health care provider during your annual well check will depend on your age, overall health, lifestyle risk factors, and family history of disease. Counseling Your health care provider may ask you questions about your:  Alcohol use.  Tobacco use.  Drug use.  Emotional well-being.  Home and relationship well-being.  Sexual activity.  Eating habits.  Work and work Statistician.  Method of birth control.  Menstrual cycle.  Pregnancy history.  Screening You may have the following tests or measurements:  Height, weight, and BMI.  Diabetes screening. This is done by checking your blood sugar (glucose) after you have not eaten for a while (fasting).  Blood pressure.  Lipid and cholesterol levels. These may be checked every 5 years starting at age 21.  Skin check.  Hepatitis C blood test.  Hepatitis B blood test.  Sexually transmitted disease  (STD) testing.  BRCA-related cancer screening. This may be done if you have a family history of breast, ovarian, tubal, or peritoneal cancers.  Pelvic exam and Pap test. This may be done every 3 years starting at age 64. Starting at age 57, this may be done every 5 years if you have a Pap test in combination with an HPV test.  Discuss your test results, treatment options, and if necessary, the need for more tests with your health care provider. Vaccines Your health care provider may recommend certain vaccines, such as:  Influenza vaccine. This is recommended every year.  Tetanus, diphtheria, and acellular pertussis (Tdap, Td) vaccine. You may need a Td booster every 10 years.  Varicella vaccine. You may need this if you have not been vaccinated.  HPV vaccine. If you are 60 or younger, you may need three doses over 6 months.  Measles, mumps, and rubella (MMR) vaccine. You may need at least one dose of MMR. You may also need a second dose.  Pneumococcal 13-valent conjugate (PCV13) vaccine. You may need this if you have certain conditions and were not previously vaccinated.  Pneumococcal polysaccharide (PPSV23) vaccine. You may need one or two doses if you smoke cigarettes or if you have certain conditions.  Meningococcal vaccine. One dose is recommended if you are age 19-21 years and a first-year college student living in a residence hall, or if you have one of several medical conditions. You may also need additional booster doses.  Hepatitis A vaccine. You may need this if you have certain conditions or if you travel or work  in places where you may be exposed to hepatitis A.  Hepatitis B vaccine. You may need this if you have certain conditions or if you travel or work in places where you may be exposed to hepatitis B.  Haemophilus influenzae type b (Hib) vaccine. You may need this if you have certain risk factors.  Talk to your health care provider about which screenings and vaccines  you need and how often you need them. This information is not intended to replace advice given to you by your health care provider. Make sure you discuss any questions you have with your health care provider. Document Released: 02/10/2002 Document Revised: 09/03/2016 Document Reviewed: 10/16/2015 Elsevier Interactive Patient Education  [employer]. .

## 2018-04-23 NOTE — Progress Notes (Signed)
Subjective:    Patient ID: Stacey Moore, female    DOB: Dec 29, 1981, 37 y.o.   MRN: [id number]  HPI  Pt in for CPE.  Pt works at [employer]. She has been exercising for 2 weeks. Admits not eating real healthy. 2 caffeinated beverage 3 nights at week when working. Not smoker. Only social use alcohol   Pt appears to be overdue for tdap.    Review of Systems  Constitutional: Negative for chills, fatigue and fever.  HENT: Negative for congestion, ear discharge, ear pain, postnasal drip, sinus pressure, sinus pain and sore throat.   Respiratory: Negative for cough, chest tightness and wheezing.   Cardiovascular: Negative for chest pain and palpitations.  Gastrointestinal: Negative for abdominal distention, abdominal pain, anal bleeding, constipation, diarrhea and nausea.       Occasional heart burn when skips prilosec.  Musculoskeletal: Negative for back pain.  Skin: Negative for rash.  Neurological: Negative for dizziness and headaches.  Hematological: Negative for adenopathy. Does not bruise/bleed easily.  Psychiatric/Behavioral: Negative for behavioral problems and confusion. The patient is not nervous/anxious.    Past Medical History:  Diagnosis Date  . Allergy   . GERD (gastroesophageal reflux disease)      Social History   Socioeconomic History  . Marital status: Single    Spouse name: Not on file  . Number of children: Not on file  . Years of education: Not on file  . Highest education level: Not on file  Occupational History  . Not on file  Social Needs  . Financial resource strain: Not on file  . Food insecurity:    Worry: Not on file    Inability: Not on file  . Transportation needs:    Medical: Not on file    Non-medical: Not on file  Tobacco Use  . Smoking status: Never Smoker  . Smokeless tobacco: Never Used  Substance and Sexual Activity  . Alcohol use: Yes    Alcohol/week: 1.2 oz    Types: 1 Glasses of wine, 1 Cans of beer per week    Comment:  socially couple of drinks. maybe 1-2 times a week.  . Drug use: No  . Sexual activity: Yes    Birth control/protection: Condom  Lifestyle  . Physical activity:    Days per week: Not on file    Minutes per session: Not on file  . Stress: Not on file  Relationships  . Social connections:    Talks on phone: Not on file    Gets together: Not on file    Attends religious service: Not on file    Active member of club or organization: Not on file    Attends meetings of clubs or organizations: Not on file    Relationship status: Not on file  . Intimate partner violence:    Fear of current or ex partner: Not on file    Emotionally abused: Not on file    Physically abused: Not on file    Forced sexual activity: Not on file  Other Topics Concern  . Not on file  Social History Narrative  . Not on file    Past Surgical History:  Procedure Laterality Date  . LIPOMA EXCISION N/A 04/25/2016   Procedure: EXCISION LIPOMA;  Surgeon: Ralene Ok, MD;  [facility]: [facility] OR;  Service: General;  Laterality: N/A;  . WISDOM TOOTH EXTRACTION      Family History  Problem Relation Age of Onset  . Hyperlipidemia Mother   . Cancer  Maternal Grandfather        Lung  . Breast cancer Paternal Grandmother        PMP    No Known Allergies  Current Outpatient Medications on File Prior to Visit  Medication Sig Dispense Refill  . omeprazole (PRILOSEC) 20 MG capsule Take 20 mg by mouth daily.     No current facility-administered medications on file prior to visit.     BP 128/78   Pulse 86   Temp 98.2 F (36.8 C) (Oral)   Resp 16   Ht 5\' 4"  (1.626 m)   Wt 162 lb (73.5 kg)   SpO2 99%   BMI 27.81 kg/m       Objective:   Physical Exam   General Mental Status- Alert. General Appearance- Not in acute distress.   Skin General: Color- Normal Color. Moisture- Normal Moisture.  Neck Carotid Arteries- Normal color. Moisture- Normal Moisture. No carotid bruits. No JVD.  Chest and Lung  Exam Auscultation: Breath Sounds:-Normal.  Cardiovascular Auscultation:Rythm- Regular. Murmurs & Other Heart Sounds:Auscultation of the heart reveals- No Murmurs.  Abdomen Inspection:-Inspeection Normal. Palpation/Percussion:Note:No mass. Palpation and Percussion of the abdomen reveal- Non Tender, Non Distended + BS, no rebound or guarding.    Neurologic Cranial Nerve exam:- CN III-XII intact(No nystagmus), symmetric smile. Strength:- 5/5 equal and symmetric strength both upper and lower extremities.     Assessment & Plan:  For you wellness exam today I have ordered cbc, cmp, lipid panel, and ua  Vaccine given today tdap.  Recommend exercise and healthy diet.  We will let you know lab results as they come in.  Follow up date appointment will be determined after lab review.   Mackie Pai, PA-C

## 2018-07-05 ENCOUNTER — Telehealth: Payer: Self-pay | Admitting: Obstetrics & Gynecology

## 2018-07-05 NOTE — Telephone Encounter (Signed)
Patient called and said she thinks she may have a yeast infection. She's like to come in for a visit today, if possible. Pharmacy on file is correct if needed.

## 2018-07-05 NOTE — Telephone Encounter (Signed)
Spoke with patient. Reports vaginal discomfort, redness and white vaginal d/c. Symptoms started 7/1, used OTC monistat 1 day, symptoms improved, did not resolve. Patient requesting OV.   OV scheduled with Dr.  Sabra Heck on 7/9 at [place] to provider for final review. Patient is agreeable to disposition. Will close encounter.

## 2018-07-06 ENCOUNTER — Other Ambulatory Visit: Payer: Self-pay

## 2018-07-06 ENCOUNTER — Encounter: Payer: Self-pay | Admitting: Obstetrics & Gynecology

## 2018-07-06 ENCOUNTER — Ambulatory Visit: Payer: 59 | Admitting: Obstetrics & Gynecology

## 2018-07-06 VITALS — BP 115/74 | HR 76 | Resp 14 | Ht 64.0 in | Wt 158.0 lb

## 2018-07-06 DIAGNOSIS — N898 Other specified noninflammatory disorders of vagina: Secondary | ICD-10-CM

## 2018-07-06 DIAGNOSIS — Z308 Encounter for other contraceptive management: Secondary | ICD-10-CM | POA: Diagnosis not present

## 2018-07-06 NOTE — Progress Notes (Signed)
GYNECOLOGY  VISIT  CC:    HPI: 37 y.o. G0P0000 Single Caucasian female here for vaginal discomfort and some discharge.  States she feels uncomfortable. She states that she used OTC Monistat, one day treatment, last week.  Feels this helped.  Denies odor.  Denies fever, pelvic pain, back pain.  Has no itching.  Also has no urinary symptoms.     Wants to discuss OCPs.  Has been considering this for contraception.  She is still considering this but will let me know if desires to start.  Risks discussed with pt in detail including DUB, DVT/PE, headache, nausea, increased BP.  Instruction sheet provided and reviewed with pt when to start and what to do if misses pill/pills.   GYNECOLOGIC HISTORY: Patient's last menstrual period was 06/14/2018 (exact date). Contraception: Condoms  Menopausal hormone therapy: NA  Patient Active Problem List   Diagnosis Date Noted  . Left hip pain 08/04/2016  . 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 GERD (gastroesophageal reflux disease) 07/23/2015  . Allergic rhinitis 07/23/2015    Past Medical History:  Diagnosis Date  . Allergy   . GERD (gastroesophageal reflux disease)     Past Surgical History:  Procedure Laterality Date  . LIPOMA EXCISION N/A 04/25/2016   Procedure: EXCISION LIPOMA;  Surgeon: Ralene Ok, MD;  Location: [place];  Service: General;  Laterality: N/A;  . WISDOM TOOTH EXTRACTION      MEDS:   Current Outpatient Medications on File Prior to Visit  Medication Sig Dispense Refill  . omeprazole (PRILOSEC) 20 MG capsule Take 20 mg by mouth daily.     No current facility-administered medications on file prior to visit.     ALLERGIES: Patient has no known allergies.  Family History  Problem Relation Age of Onset  . Hyperlipidemia Mother   . Cancer Maternal Grandfather        Lung  . Breast cancer Paternal Grandmother        PMP    SH:  Single, non smoker  Review of Systems  Constitutional: Negative.   HENT: Negative.   Eyes: Negative.    Cardiovascular: Negative.   Gastrointestinal:       Abnormal vaginal discharge  Genitourinary: Negative.   Musculoskeletal: Negative.   Skin: Negative.   Neurological: Negative.   Endo/Heme/Allergies: Negative.   Psychiatric/Behavioral: Negative.     PHYSICAL EXAMINATION:    BP 115/74   Pulse 76   Resp 14   Ht 5\' 4"  (1.626 m)   Wt 158 lb (71.7 kg)   LMP 06/14/2018 (Exact Date)   BMI 27.12 kg/m     General appearance: alert, cooperative and appears stated age Lymph:  No inguinal LAD noted  Pelvic: External genitalia:  no lesions              Urethra:  normal appearing urethra with no masses, tenderness or lesions              Bartholins and Skenes: normal                 Vagina: normal appearing vagina with normal color and whitish discharge noted, no lesions              Cervix: no lesions              Bimanual Exam:  Uterus:  normal size, contour, position, consistency, mobility, non-tender              Adnexa: no mass, fullness, tenderness  Chaperone was present  for exam.  Assessment: Vaginal discharge Considering OCPs for contraception  Plan: Affirm pending.  Wants wait for results before starting treatment. Will let me know if decides to start OCPs.  Declines RX today.     ~15 minutes spent with patient >50% of time was in face to face discussion of above.

## 2018-07-07 ENCOUNTER — Telehealth: Payer: Self-pay | Admitting: Obstetrics & Gynecology

## 2018-07-07 LAB — VAGINITIS/VAGINOSIS, DNA PROBE
CANDIDA SPECIES: NEGATIVE
GARDNERELLA VAGINALIS: POSITIVE — AB
Trichomonas vaginosis: NEGATIVE

## 2018-07-07 MED ORDER — TINIDAZOLE 500 MG PO TABS: ORAL_TABLET | ORAL | 0 refills | Status: DC

## 2018-07-07 NOTE — Telephone Encounter (Signed)
rx for tindamax sent to pharmacy on file.

## 2018-07-29 ENCOUNTER — Telehealth: Payer: Self-pay | Admitting: Obstetrics & Gynecology

## 2018-07-29 NOTE — Telephone Encounter (Signed)
Call to patient. Reports vaginal irritation symptoms improved after treatment with Tindamax however, they have returned. No fevers or pelvic pain. No vaginal discharge. Reports only irritation.  Office visit with Dr. Sabra Heck at 1245 07/30/18 scheduled.

## 2018-07-29 NOTE — Telephone Encounter (Signed)
Patient stated that she had a bacteria infection and the symptoms went away. Patient stated that she believes those symptoms are now coming back.

## 2018-07-30 ENCOUNTER — Ambulatory Visit (INDEPENDENT_AMBULATORY_CARE_PROVIDER_SITE_OTHER): Payer: 59 | Admitting: Obstetrics & Gynecology

## 2018-07-30 ENCOUNTER — Encounter: Payer: Self-pay | Admitting: Obstetrics & Gynecology

## 2018-07-30 VITALS — BP 132/76 | HR 96 | Resp 16 | Ht 64.0 in | Wt 161.8 lb

## 2018-07-30 DIAGNOSIS — Z113 Encounter for screening for infections with a predominantly sexual mode of transmission: Secondary | ICD-10-CM | POA: Diagnosis not present

## 2018-07-30 DIAGNOSIS — N898 Other specified noninflammatory disorders of vagina: Secondary | ICD-10-CM | POA: Diagnosis not present

## 2018-07-30 MED ORDER — METRONIDAZOLE 0.75 % VA GEL: 1.0000 | Freq: Every day | VAGINAL | 0 refills | Status: DC

## 2018-07-30 NOTE — Progress Notes (Signed)
GYNECOLOGY  VISIT  CC:   Vaginal discharge  HPI: 37 y.o. G0P0000 Single Caucasian female here for vaginitis symptoms.  She had this last month and treated with OTC monistat.  This didn't help so she came into the office.  Vaginitis testing showed BV.  She was treated with Tindamax and symptoms resolved.    No new sexual partner but did a new sexual partner last month so would like STD testing as well.  GYNECOLOGIC HISTORY: Patient's last menstrual period was 07/13/2018 (approximate). Contraception: condoms  Menopausal hormone therapy: none  Patient Active Problem List   Diagnosis Date Noted  . Left hip pain 08/04/2016  . 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 GERD (gastroesophageal reflux disease) 07/23/2015  . Allergic rhinitis 07/23/2015    Past Medical History:  Diagnosis Date  . Allergy   . GERD (gastroesophageal reflux disease)     Past Surgical History:  Procedure Laterality Date  . LIPOMA EXCISION N/A 04/25/2016   Procedure: EXCISION LIPOMA;  Surgeon: Ralene Ok, MD;  Location: [place];  Service: General;  Laterality: N/A;  . WISDOM TOOTH EXTRACTION      MEDS:   Current Outpatient Medications on File Prior to Visit  Medication Sig Dispense Refill  . omeprazole (PRILOSEC) 20 MG capsule Take 20 mg by mouth daily.    Marland Kitchen PAU D ARCO PO Take by mouth daily.     No current facility-administered medications on file prior to visit.     ALLERGIES: Patient has no known allergies.  Family History  Problem Relation Age of Onset  . Hyperlipidemia Mother   . Cancer Maternal Grandfather        Lung  . Breast cancer Paternal Grandmother        PMP    SH:  Single, social smoking  Review of Systems  Genitourinary:       Abnormal discharge   All other systems reviewed and are negative.   PHYSICAL EXAMINATION:    BP 132/76 (BP Location: Right Arm, Patient Position: Sitting, Cuff Size: Normal)   Pulse 96   Resp 16   Ht 5\' 4"  (1.626 m)   Wt 161 lb 12.8 oz (73.4 kg)   LMP  07/13/2018 (Approximate)   BMI 27.77 kg/m     General appearance: alert, cooperative and appears stated age Lymph:  no inguinal LAD noted  Pelvic: External genitalia:  no lesions              Urethra:  normal appearing urethra with no masses, tenderness or lesions              Bartholins and Skenes: normal                 Vagina: normal appearing vagina with normal color and whitish discharge noted, no lesions              Cervix: no lesions              Bimanual Exam:  Uterus:  normal size, contour, position, consistency, mobility, non-tender              Adnexa: no mass, fullness, tenderness  Chaperone was present for exam.  Assessment: Vaginal irritation and discharge  Plan: Yeast, BV, trich, GC/Chl testing obtained today Metrogel 0.75% nightly x 5 nights.  Rx to pharmacy.

## 2018-08-01 LAB — NUSWAB VAGINITIS PLUS (VG+)
CHLAMYDIA TRACHOMATIS, NAA: NEGATIVE
Candida albicans, NAA: NEGATIVE
Candida glabrata, NAA: NEGATIVE
NEISSERIA GONORRHOEAE, NAA: NEGATIVE
Trich vag by NAA: NEGATIVE

## 2018-08-03 ENCOUNTER — Telehealth: Payer: Self-pay | Admitting: Obstetrics & Gynecology

## 2018-08-03 NOTE — Telephone Encounter (Signed)
Patient left message on voicemail stating she has a question about her test results.

## 2018-08-04 NOTE — Telephone Encounter (Signed)
Message left to return call to Stacey Moore at [phone].    

## 2018-09-20 ENCOUNTER — Ambulatory Visit (INDEPENDENT_AMBULATORY_CARE_PROVIDER_SITE_OTHER): Payer: 59 | Admitting: Obstetrics & Gynecology

## 2018-09-20 ENCOUNTER — Encounter: Payer: Self-pay | Admitting: Obstetrics & Gynecology

## 2018-09-20 VITALS — BP 110/80 | HR 88 | Temp 98.6°F | Resp 14 | Ht 64.0 in | Wt 155.8 lb

## 2018-09-20 DIAGNOSIS — N898 Other specified noninflammatory disorders of vagina: Secondary | ICD-10-CM

## 2018-09-20 DIAGNOSIS — Z202 Contact with and (suspected) exposure to infections with a predominantly sexual mode of transmission: Secondary | ICD-10-CM | POA: Diagnosis not present

## 2018-09-20 LAB — POCT URINALYSIS DIPSTICK
Bilirubin, UA: NEGATIVE
Blood, UA: NEGATIVE
Glucose, UA: NEGATIVE
KETONES UA: NEGATIVE
Leukocytes, UA: NEGATIVE
NITRITE UA: NEGATIVE
PROTEIN UA: NEGATIVE
Urobilinogen, UA: 0.2 E.U./dL
pH, UA: 5 (ref 5.0–8.0)

## 2018-09-20 MED ORDER — METRONIDAZOLE 0.75 % VA GEL
VAGINAL | 0 refills | Status: DC
Start: 2018-09-20 — End: 2018-11-09

## 2018-09-20 NOTE — Progress Notes (Signed)
GYNECOLOGY  VISIT  CC:   Vaginal discharge  HPI: 37 y.o. G0P0000 Single Caucasian female here for vaginal discharge and irritation x 3 days.  H/O recurrent vaginitis.  Did use OTC monitstat that did not help.  Most recent vaginitis testing was negative--pt feels she sometimes thinks she is anxious about a recurrent issue as she has recurrent vaginitis in the past (yeast).  Prior testing showed BV.  Denies urinary complaints or pelvic pain.  She does have a new partner--new since last visit.  Would like STD testing obtained today.  H/O Hep b vaccinations but titer was low.  Thinks she only had two of the three vaccinations for the second series so desires hep B testing as well.  GYNECOLOGIC HISTORY: Patient's last menstrual period was 09/04/2018 (approximate). Contraception: condoms (but not always) Menopausal hormone therapy: none  Patient Active Problem List   Diagnosis Date Noted  . Left hip pain 08/04/2016  . 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 GERD (gastroesophageal reflux disease) 07/23/2015  . Allergic rhinitis 07/23/2015    Past Medical History:  Diagnosis Date  . Allergy   . GERD (gastroesophageal reflux disease)     Past Surgical History:  Procedure Laterality Date  . LIPOMA EXCISION N/A 04/25/2016   Procedure: EXCISION LIPOMA;  Surgeon: Ralene Ok, MD;  Location: [place];  Service: General;  Laterality: N/A;  . WISDOM TOOTH EXTRACTION      MEDS:   Current Outpatient Medications on File Prior to Visit  Medication Sig Dispense Refill  . omeprazole (PRILOSEC) 20 MG capsule Take 20 mg by mouth daily.    Marland Kitchen PAU D ARCO PO Take by mouth daily.     No current facility-administered medications on file prior to visit.     ALLERGIES: Patient has no known allergies.  Family History  Problem Relation Age of Onset  . Hyperlipidemia Mother   . Cancer Maternal Grandfather        Lung  . Breast cancer Paternal Grandmother        PMP    SH:  Single, non smoker  Review of Systems   Genitourinary:       Vaginal itching Abnormal discharge   All other systems reviewed and are negative.   PHYSICAL EXAMINATION:    BP 110/80 (BP Location: Right Arm, Patient Position: Sitting, Cuff Size: Large)   Pulse 88   Temp 98.6 F (37 C) (Oral)   Resp 14   Ht 5\' 4"  (1.626 m)   Wt 155 lb 12.8 oz (70.7 kg)   LMP 09/04/2018 (Approximate)   BMI 26.74 kg/m     General appearance: alert, cooperative and appears stated age Lymph:  no inguinal LAD noted  Pelvic: External genitalia:  no lesions              Urethra:  normal appearing urethra with no masses, tenderness or lesions              Bartholins and Skenes: normal                 Vagina: normal appearing vagina with whitish discharge present, no significant odor present,  No lesions noted              Cervix: no lesions              Bimanual Exam:  Uterus:  normal size, contour, position, consistency, mobility, non-tender              Adnexa: no mass, fullness, tenderness  Chaperone was present for exam.  Assessment: Vaginal irritation with some discharge noted on exam STD exposure H/O recurrent yeast vaginitis in the past   Plan: HIV, RPR, Hep B and c testing obtained today Vaginitis testing obtained as well. Will start metrogel 0.75% nightly x 5 nights.  Pt desires to start treatment now even if new rx is needed.

## 2018-09-21 LAB — NUSWAB VAGINITIS PLUS (VG+)
Candida albicans, NAA: NEGATIVE
Candida glabrata, NAA: NEGATIVE
Chlamydia trachomatis, NAA: NEGATIVE
Neisseria gonorrhoeae, NAA: NEGATIVE
TRICH VAG BY NAA: NEGATIVE

## 2018-09-21 LAB — HEP, RPR, HIV PANEL
HIV Screen 4th Generation wRfx: NONREACTIVE
Hepatitis B Surface Ag: NEGATIVE
RPR Ser Ql: NONREACTIVE

## 2018-09-21 LAB — HEPATITIS C ANTIBODY

## 2018-11-09 ENCOUNTER — Other Ambulatory Visit: Payer: Self-pay

## 2018-11-09 ENCOUNTER — Ambulatory Visit (INDEPENDENT_AMBULATORY_CARE_PROVIDER_SITE_OTHER): Payer: 59 | Admitting: Obstetrics & Gynecology

## 2018-11-09 ENCOUNTER — Encounter: Payer: Self-pay | Admitting: Obstetrics & Gynecology

## 2018-11-09 VITALS — BP 116/84 | HR 66 | Resp 14 | Ht 64.0 in | Wt 160.2 lb

## 2018-11-09 DIAGNOSIS — N898 Other specified noninflammatory disorders of vagina: Secondary | ICD-10-CM

## 2018-11-09 NOTE — Progress Notes (Signed)
GYNECOLOGY  VISIT  CC:   Questionable vaginal infection  HPI: 37 y.o. G0P0000 Single Caucasian female here for testing for possible vaginitis.  Pt has hx of recurrent vaginitis and, at times, is not sure what is normal and what is not.  Reports having milky discharge without odor for the past 10 days.  As well she's felt some vaginal discomfort.  She is SA and uses condoms regularly.  She is not on OCPs.  Denies urinary symptoms, pelvic pain or fever.  Pt states she just needs some confirmation that this is normal.  Does have new partner this last month so would like STD testing as well.  GYNECOLOGIC HISTORY: Patient's last menstrual period was 10/31/2018. Contraception: condoms Menopausal hormone therapy: none  Patient Active Problem List   Diagnosis Date Noted  . Left hip pain 08/04/2016  . 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 GERD (gastroesophageal reflux disease) 07/23/2015  . Allergic rhinitis 07/23/2015    Past Medical History:  Diagnosis Date  . Allergy   . GERD (gastroesophageal reflux disease)     Past Surgical History:  Procedure Laterality Date  . LIPOMA EXCISION N/A 04/25/2016   Procedure: EXCISION LIPOMA;  Surgeon: Ralene Ok, MD;  Location: [place];  Service: General;  Laterality: N/A;  . WISDOM TOOTH EXTRACTION      MEDS:   Current Outpatient Medications on File Prior to Visit  Medication Sig Dispense Refill  . omeprazole (PRILOSEC) 20 MG capsule Take 20 mg by mouth daily.    Marland Kitchen PAU D ARCO PO Take by mouth daily.     No current facility-administered medications on file prior to visit.     ALLERGIES: Patient has no known allergies.  Family History  Problem Relation Age of Onset  . Hyperlipidemia Mother   . Cancer Maternal Grandfather        Lung  . Breast cancer Paternal Grandmother        PMP    SH:  Single, non smoker  Review of Systems  Genitourinary: Positive for vaginal discharge.  All other systems reviewed and are negative.   PHYSICAL EXAMINATION:     BP 116/84 (BP Location: Right Arm, Patient Position: Sitting, Cuff Size: Normal)   Pulse 66   Resp 14   Ht 5\' 4"  (1.626 m)   Wt 160 lb 4 oz (72.7 kg)   LMP 10/31/2018   BMI 27.51 kg/m     General appearance: alert, cooperative and appears stated age Lymph:  no inguinal LAD noted  Pelvic: External genitalia:  no lesions              Urethra:  normal appearing urethra with no masses, tenderness or lesions              Bartholins and Skenes: normal                 Vagina: normal appearing vagina with scant vaginal discharge, no odor, no lesions              Cervix: no lesions              Bimanual Exam:  Uterus:  normal size, contour, position, consistency, mobility, non-tender              Adnexa: no mass, fullness, tenderness  Chaperone was present for exam.  Assessment: Vaginal discharge with last two tests being negative Recent new partner  Plan: nuswab testing with STD testing obtained today.  Pt will be called with results.

## 2018-11-11 LAB — NUSWAB VAGINITIS PLUS (VG+)
CANDIDA ALBICANS, NAA: NEGATIVE
CANDIDA GLABRATA, NAA: NEGATIVE
CHLAMYDIA TRACHOMATIS, NAA: NEGATIVE
NEISSERIA GONORRHOEAE, NAA: NEGATIVE
TRICH VAG BY NAA: NEGATIVE

## 2019-03-01 ENCOUNTER — Ambulatory Visit: Payer: 59 | Admitting: Obstetrics & Gynecology

## 2019-03-07 ENCOUNTER — Other Ambulatory Visit: Payer: Self-pay

## 2019-03-07 ENCOUNTER — Ambulatory Visit (INDEPENDENT_AMBULATORY_CARE_PROVIDER_SITE_OTHER): Payer: 59 | Admitting: Obstetrics & Gynecology

## 2019-03-07 ENCOUNTER — Encounter: Payer: Self-pay | Admitting: Obstetrics & Gynecology

## 2019-03-07 ENCOUNTER — Other Ambulatory Visit (HOSPITAL_COMMUNITY)
Admission: RE | Admit: 2019-03-07 | Discharge: 2019-03-07 | Disposition: A | Discharge status: Home / Self Care | Payer: 59 | Source: Ambulatory Visit | Attending: Obstetrics & Gynecology | Admitting: Obstetrics & Gynecology

## 2019-03-07 VITALS — BP 134/80 | HR 88 | Resp 16 | Ht 64.0 in | Wt 152.2 lb

## 2019-03-07 DIAGNOSIS — Z124 Encounter for screening for malignant neoplasm of cervix: Secondary | ICD-10-CM | POA: Diagnosis not present

## 2019-03-07 DIAGNOSIS — N898 Other specified noninflammatory disorders of vagina: Secondary | ICD-10-CM

## 2019-03-07 DIAGNOSIS — Z113 Encounter for screening for infections with a predominantly sexual mode of transmission: Secondary | ICD-10-CM

## 2019-03-07 DIAGNOSIS — Z01419 Encounter for gynecological examination (general) (routine) without abnormal findings: Secondary | ICD-10-CM

## 2019-03-07 NOTE — Progress Notes (Signed)
38 y.o. G0P0000 Single Caucasian female here for annual exam.  Doing well.  Cycles are regular and flow lasts 5-7 days.  Flow is heavier every other month.  Having a little bit of discharge and wants to do testing today.  Does desire STD testing as well.  Has uterine fibroid noted on PUS 01/21/18.  Doesn't feel like cycles have changed at all.  LMP:  02/25/2019    Sexually active: Yes.    The current method of family planning is condoms most of the time.   Exercising: No.   Smoker:  Yes, socially   Health Maintenance: Pap:  12/30/17 Neg   10/17/16 Neg. HR HPV:neg History of abnormal Pap:  no TDaP:  2019 Hep C testing: 09/20/18 Neg  Screening Labs: PCP   reports that she has been smoking. She has never used smokeless tobacco. She reports current alcohol use of about 2.0 standard drinks of alcohol per week. She reports that she does not use drugs.  Past Medical History:  Diagnosis Date  . Allergy   . GERD (gastroesophageal reflux disease)     Past Surgical History:  Procedure Laterality Date  . LIPOMA EXCISION N/A 04/25/2016   Procedure: EXCISION LIPOMA;  Surgeon: Ralene Ok, MD;  Location: [place];  Service: General;  Laterality: N/A;  . WISDOM TOOTH EXTRACTION      Current Outpatient Medications  Medication Sig Dispense Refill  . omeprazole (PRILOSEC) 20 MG capsule Take 20 mg by mouth daily.    Marland Kitchen PAU D ARCO PO Take by mouth daily.     No current facility-administered medications for this visit.     Family History  Problem Relation Age of Onset  . Hyperlipidemia Mother   . Cancer Maternal Grandfather        Lung  . Breast cancer Paternal Grandmother        PMP    Review of Systems  All other systems reviewed and are negative.   Exam:   BP 134/80 (BP Location: Right Arm, Patient Position: Sitting, Cuff Size: Normal)   Pulse 88   Resp 16   Ht 5\' 4"  (1.626 m)   Wt 152 lb 3.2 oz (69 kg)   LMP 01/30/2019 (Exact Date)   BMI 26.13 kg/m     Height: 5\' 4"  (162.6 cm)   Ht Readings from Last 3 Encounters:  03/07/19 5\' 4"  (1.626 m)  11/09/18 5\' 4"  (1.626 m)  09/20/18 5\' 4"  (1.626 m)    General appearance: alert, cooperative and appears stated age Head: Normocephalic, without obvious abnormality, atraumatic Neck: no adenopathy, supple, symmetrical, trachea midline and thyroid normal to inspection and palpation Lungs: clear to auscultation bilaterally Breasts: normal appearance, no masses or tenderness Heart: regular rate and rhythm Abdomen: soft, non-tender; bowel sounds normal; no masses,  no organomegaly Extremities: extremities normal, atraumatic, no cyanosis or edema Skin: Skin color, texture, turgor normal. No rashes or lesions Lymph nodes: Cervical, supraclavicular, and axillary nodes normal. No abnormal inguinal nodes palpated Neurologic: Grossly normal   Pelvic: External genitalia:  no lesions              Urethra:  normal appearing urethra with no masses, tenderness or lesions              Bartholins and Skenes: normal                 Vagina: normal appearing vagina with normal color and discharge, no lesions  Cervix: no lesions              Pap taken: Yes.   Bimanual Exam:  Uterus:  Enlarged with nodularity about 5cm off to right of uterus, no change in exam              Adnexa: normal adnexa and no mass, fullness, tenderness               Rectovaginal: Confirms               Anus:  normal sphincter tone, no lesions  Chaperone was present for exam.  A:  Well Woman with normal exam 4.8cm intramural fibroid Social smoker Concerns about recurrent vaginal discharge (but most testing has been negative over the last year Condom use for contraception H/o GERD  P:   Mammogram guidelines reviewed. pap smear obtained today as she desires yearly pap smear.  GC/Chl and trich testing obtained with pap smear Vaginitis testing also obtianed Hep B/C, HIV and RPR obtained today.  Results will be communicated to pt. Baking soda and  water douche discussed.  Information provided for using this when having sensation of increased discharge (if testing is negative she may use this prn) Return annually or prn

## 2019-03-07 NOTE — Patient Instructions (Signed)
2 cups of water mixed with 1/2 tsp baking soda Hydrogen peroxide and water mixed 1:1

## 2019-03-08 LAB — CYTOLOGY - PAP
CHLAMYDIA, DNA PROBE: NEGATIVE
DIAGNOSIS: NEGATIVE
Neisseria Gonorrhea: NEGATIVE
Trichomonas: NEGATIVE

## 2019-03-08 LAB — HEP, RPR, HIV PANEL
HIV SCREEN 4TH GENERATION: NONREACTIVE
Hepatitis B Surface Ag: NEGATIVE
RPR: NONREACTIVE

## 2019-03-08 LAB — HEPATITIS C ANTIBODY: Hep C Virus Ab: <0.1 s/co ratio (ref 0.0–0.9)

## 2019-03-09 LAB — NUSWAB BV AND CANDIDA, NAA
CANDIDA GLABRATA, NAA: NEGATIVE
Candida albicans, NAA: NEGATIVE

## 2019-03-10 ENCOUNTER — Telehealth: Payer: Self-pay | Admitting: *Deleted

## 2019-03-10 NOTE — Telephone Encounter (Signed)
Pt notified.  Verbalized understanding.

## 2019-03-10 NOTE — Telephone Encounter (Signed)
AEX Scheduled 07/24/20 Called patient. Unable to leave message. Voicemail full.

## 2019-03-10 NOTE — Telephone Encounter (Signed)
-----   Message from Megan Salon, MD sent at 03/09/2019  1:10 PM EDT ----- Please call pt and let her know HIV, RPR, Hep B and C are negative.  GC/Chl, trich negative.  Yeast and BV negative.  Pap normal.  02 recall.  Thanks.

## 2019-06-22 ENCOUNTER — Telehealth: Payer: Self-pay | Admitting: Obstetrics & Gynecology

## 2019-06-22 NOTE — Telephone Encounter (Signed)
Spoke with patient. Requesting OV for HSV testing. Patient states her partner is experiencing "a HSV breakout with lesions" and he has never had another partner. Patient states she has had other partners, no current symptoms or outbreaks. Asking if HSV was done at 03/07/19 AEX? Advised HSV testing is not part of routine screening for STD unless there is a know exposure/concern or symptoms. Patient requesting OV. OV scheduled for 6/25 at 8am with Dr. Sabra Heck. Covid 19 prescreen neg, precautions reviewed.  Routing to provider for final review. Patient is agreeable to disposition. Will close encounter.

## 2019-06-22 NOTE — Telephone Encounter (Signed)
Patient would like to have "herpes testing".

## 2019-06-23 ENCOUNTER — Ambulatory Visit: Payer: 59 | Admitting: Obstetrics & Gynecology

## 2019-06-23 ENCOUNTER — Other Ambulatory Visit: Payer: Self-pay

## 2019-06-23 ENCOUNTER — Encounter: Payer: Self-pay | Admitting: Obstetrics & Gynecology

## 2019-06-23 VITALS — BP 116/80 | HR 84 | Temp 98.1°F | Ht 64.0 in | Wt 145.0 lb

## 2019-06-23 DIAGNOSIS — Z20828 Contact with and (suspected) exposure to other viral communicable diseases: Secondary | ICD-10-CM

## 2019-06-23 NOTE — Progress Notes (Signed)
GYNECOLOGY  VISIT  CC:   HSV testing  HPI: 38 y.o. G0P0000 Single Caucasian female here for HSV testing.  Reports an ex-partner recently contacted her and advised her that he may have an HSV outbreak.  He advised that she should consider testing.  She's never had an outbreak in the past.  She is here to discuss options.  Would like to be tested today.  Serology vs active lesion testing discussed.  Questions answered.  Would like to proceed today.  GYNECOLOGIC HISTORY: Patient's last menstrual period was 06/17/2019 (approximate). Contraception: condoms sometimes  Menopausal hormone therapy: none  Patient Active Problem List   Diagnosis Date Noted  . Left hip pain 08/04/2016  . 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 GERD (gastroesophageal reflux disease) 07/23/2015  . Allergic rhinitis 07/23/2015    Past Medical History:  Diagnosis Date  . Allergy   . GERD (gastroesophageal reflux disease)     Past Surgical History:  Procedure Laterality Date  . LIPOMA EXCISION N/A 04/25/2016   Procedure: EXCISION LIPOMA;  Surgeon: Ralene Ok, MD;  Location: [facility];  Service: General;  Laterality: N/A;  . WISDOM TOOTH EXTRACTION      MEDS:   Current Outpatient Medications on File Prior to Visit  Medication Sig Dispense Refill  . omeprazole (PRILOSEC) 20 MG capsule Take 20 mg by mouth daily.    Marland Kitchen PAU D ARCO PO Take by mouth daily.     No current facility-administered medications on file prior to visit.     ALLERGIES: Patient has no known allergies.  Family History  Problem Relation Age of Onset  . Hyperlipidemia Mother   . Lung cancer Maternal Grandfather   . Breast cancer Paternal Grandmother        PMP    SH:  Single, social smoker  Review of Systems  All other systems reviewed and are negative.   PHYSICAL EXAMINATION:    BP 116/80   Pulse 84   Temp 98.1 F (36.7 C) (Temporal)   Ht 5\' 4"  (1.626 m)   Wt 145 lb (65.8 kg)   LMP 06/17/2019 (Approximate)   BMI 24.89 kg/m     General  appearance: alert, cooperative and appears stated age No physical exam performed.  Assessment: Possible HSV exposure  Plan: HSV 1/2 IgG antibodies will be obtained today.   ~15 minutes spent with patient >50% of time was in face to face discussion of above.

## 2019-06-23 NOTE — Addendum Note (Signed)
Addended by: Megan Salon on: 06/23/2019 08:47 AM   Modules accepted: Orders

## 2019-06-24 LAB — HSV(HERPES SIMPLEX VRS) I + II AB-IGG
HSV 1 Glycoprotein G Ab, IgG: <0.91 index (ref 0.00–0.90)
HSV 2 IgG, Type Spec: <0.91 index (ref 0.00–0.90)

## 2019-08-09 ENCOUNTER — Telehealth: Payer: Self-pay

## 2019-08-09 NOTE — Telephone Encounter (Signed)
Copied from [place] [id number]. Topic: General - Inquiry >> Aug 05, 2019  9:10 AM Virl Axe D wrote: Reason for CRM: Pt would like to know if she needs to be referred by Percell Miller again to the surgeon for a lipoma removal. Please advise.

## 2019-08-10 ENCOUNTER — Ambulatory Visit (INDEPENDENT_AMBULATORY_CARE_PROVIDER_SITE_OTHER): Payer: 59 | Admitting: Medical

## 2019-08-10 ENCOUNTER — Ambulatory Visit (HOSPITAL_BASED_OUTPATIENT_CLINIC_OR_DEPARTMENT_OTHER)
Admission: RE | Admit: 2019-08-10 | Discharge: 2019-08-10 | Disposition: A | Discharge status: Home / Self Care | Payer: 59 | Source: Ambulatory Visit | Attending: Medical | Admitting: Medical

## 2019-08-10 ENCOUNTER — Encounter: Payer: Self-pay | Admitting: Medical

## 2019-08-10 ENCOUNTER — Other Ambulatory Visit: Payer: Self-pay

## 2019-08-10 VITALS — BP 121/75 | HR 88 | Temp 97.9°F | Resp 16 | Ht 64.0 in | Wt 148.2 lb

## 2019-08-10 DIAGNOSIS — R0781 Pleurodynia: Secondary | ICD-10-CM | POA: Diagnosis not present

## 2019-08-10 DIAGNOSIS — R0789 Other chest pain: Secondary | ICD-10-CM | POA: Diagnosis not present

## 2019-08-10 DIAGNOSIS — K219 Gastro-esophageal reflux disease without esophagitis: Secondary | ICD-10-CM | POA: Diagnosis not present

## 2019-08-10 DIAGNOSIS — Z0000 Encounter for general adult medical examination without abnormal findings: Secondary | ICD-10-CM

## 2019-08-10 LAB — LIPID PANEL
Cholesterol: 142 mg/dL (ref 0–200)
HDL: 51.1 mg/dL (ref >39.00)
LDL Cholesterol: 80 mg/dL (ref 0–99)
NonHDL: 91.2
Total CHOL/HDL Ratio: 3
Triglycerides: 57 mg/dL (ref 0.0–149.0)
VLDL: 11.4 mg/dL (ref 0.0–40.0)

## 2019-08-10 LAB — COMPREHENSIVE METABOLIC PANEL
ALT: 21 U/L (ref 0–35)
AST: 22 U/L (ref 0–37)
Albumin: 4.4 g/dL (ref 3.5–5.2)
Alkaline Phosphatase: 81 U/L (ref 39–117)
BUN: 15 mg/dL (ref 6–23)
CO2: 28 mEq/L (ref 19–32)
Calcium: 9.8 mg/dL (ref 8.4–10.5)
Chloride: 102 mEq/L (ref 96–112)
Creatinine, Ser: 0.81 mg/dL (ref 0.40–1.20)
GFR: 80.01 mL/min (ref >60.00)
Glucose, Bld: 80 mg/dL (ref 70–99)
Potassium: 4 mEq/L (ref 3.5–5.1)
Sodium: 137 mEq/L (ref 135–145)
Total Bilirubin: 0.7 mg/dL (ref 0.2–1.2)
Total Protein: 6.6 g/dL (ref 6.0–8.3)

## 2019-08-10 LAB — CBC WITH DIFFERENTIAL/PLATELET
Basophils Absolute: 0 10*3/uL (ref 0.0–0.1)
Basophils Relative: 0.3 % (ref 0.0–3.0)
Eosinophils Absolute: 0.1 10*3/uL (ref 0.0–0.7)
Eosinophils Relative: 1.7 % (ref 0.0–5.0)
HCT: 39.1 % (ref 36.0–46.0)
Hemoglobin: 13.1 g/dL (ref 12.0–15.0)
Lymphocytes Relative: 28.3 % (ref 12.0–46.0)
Lymphs Abs: 2 10*3/uL (ref 0.7–4.0)
MCHC: 33.6 g/dL (ref 30.0–36.0)
MCV: 99.7 fl (ref 78.0–100.0)
Monocytes Absolute: 0.4 10*3/uL (ref 0.1–1.0)
Monocytes Relative: 6.2 % (ref 3.0–12.0)
Neutro Abs: 4.4 10*3/uL (ref 1.4–7.7)
Neutrophils Relative %: 63.5 % (ref 43.0–77.0)
Platelets: 300 10*3/uL (ref 150.0–400.0)
RBC: 3.92 Mil/uL (ref 3.87–5.11)
RDW: 12.7 % (ref 11.5–15.5)
WBC: 6.9 10*3/uL (ref 4.0–10.5)

## 2019-08-10 NOTE — Patient Instructions (Addendum)
For you wellness exam today I have ordered cbc, cmp and lipid panel.   Vaccine up to date.  Recommend exercise and healthy diet.(but hold off presently until we get labs back and cxr)  We will let you know lab results as they come in.  ekg done today.(sinus rhythm. No ischemia seen) no prior ekg to compare.  Chest xray due to potential rib area pain. Advise take tylenol short term to see if resolves/helps pain. Short term avoid nsaid due to potential gi side effect.  Counseled on potential referral to GI if gerd flares not controlled due to duration of being on ppi.  Will refer to former surgeon to evalauate if early reoccuring lipoma. Please call back and give me name of former Psychologist, sport and exercise.  Follow up date appointment will be determined after lab review.    For latisismus area pain see impact of tylenol. If pain persists might refer to sports med.   Preventive Care 50-69 Years Old, Female Preventive care refers to visits with your health care provider and lifestyle choices that can promote health and wellness. This includes:  A yearly physical exam. This may also be called an annual well check.  Regular dental visits and eye exams.  Immunizations.  Screening for certain conditions.  Healthy lifestyle choices, such as eating a healthy diet, getting regular exercise, not using drugs or products that contain nicotine and tobacco, and limiting alcohol use. What can I expect for my preventive care visit? Physical exam Your health care provider will check your:  Height and weight. This may be used to calculate body mass index (BMI), which tells if you are at a healthy weight.  Heart rate and blood pressure.  Skin for abnormal spots. Counseling Your health care provider may ask you questions about your:  Alcohol, tobacco, and drug use.  Emotional well-being.  Home and relationship well-being.  Sexual activity.  Eating habits.  Work and work Statistician.  Method of birth  control.  Menstrual cycle.  Pregnancy history. What immunizations do I need?  Influenza (flu) vaccine  This is recommended every year. Tetanus, diphtheria, and pertussis (Tdap) vaccine  You may need a Td booster every 10 years. Varicella (chickenpox) vaccine  You may need this if you have not been vaccinated. Human papillomavirus (HPV) vaccine  If recommended by your health care provider, you may need three doses over 6 months. Measles, mumps, and rubella (MMR) vaccine  You may need at least one dose of MMR. You may also need a second dose. Meningococcal conjugate (MenACWY) vaccine  One dose is recommended if you are age 77-21 years and a first-year college student living in a residence hall, or if you have one of several medical conditions. You may also need additional booster doses. Pneumococcal conjugate (PCV13) vaccine  You may need this if you have certain conditions and were not previously vaccinated. Pneumococcal polysaccharide (PPSV23) vaccine  You may need one or two doses if you smoke cigarettes or if you have certain conditions. Hepatitis A vaccine  You may need this if you have certain conditions or if you travel or work in places where you may be exposed to hepatitis A. Hepatitis B vaccine  You may need this if you have certain conditions or if you travel or work in places where you may be exposed to hepatitis B. Haemophilus influenzae type b (Hib) vaccine  You may need this if you have certain conditions. You may receive vaccines as individual doses or as more than one  vaccine together in one shot (combination vaccines). Talk with your health care provider about the risks and benefits of combination vaccines. What tests do I need?  Blood tests  Lipid and cholesterol levels. These may be checked every 5 years starting at age 80.  Hepatitis C test.  Hepatitis B test. Screening  Diabetes screening. This is done by checking your blood sugar (glucose)  after you have not eaten for a while (fasting).  Sexually transmitted disease (STD) testing.  BRCA-related cancer screening. This may be done if you have a family history of breast, ovarian, tubal, or peritoneal cancers.  Pelvic exam and Pap test. This may be done every 3 years starting at age 33. Starting at age 58, this may be done every 5 years if you have a Pap test in combination with an HPV test. Talk with your health care provider about your test results, treatment options, and if necessary, the need for more tests. Follow these instructions at home: Eating and drinking   Eat a diet that includes fresh fruits and vegetables, whole grains, lean protein, and low-fat dairy.  Take vitamin and mineral supplements as recommended by your health care provider.  Do not drink alcohol if: ? Your health care provider tells you not to drink. ? You are pregnant, may be pregnant, or are planning to become pregnant.  If you drink alcohol: ? Limit how much you have to 0-1 drink a day. ? Be aware of how much alcohol is in your drink. In the [place] one drink equals one 12 oz bottle of beer (355 mL), one 5 oz glass of wine (148 mL), or one 1 oz glass of hard liquor (44 mL). Lifestyle  Take daily care of your teeth and gums.  Stay active. Exercise for at least 30 minutes on 5 or more days each week.  Do not use any products that contain nicotine or tobacco, such as cigarettes, e-cigarettes, and chewing tobacco. If you need help quitting, ask your health care provider.  If you are sexually active, practice safe sex. Use a condom or other form of birth control (contraception) in order to prevent pregnancy and STIs (sexually transmitted infections). If you plan to become pregnant, see your health care provider for a preconception visit. What's next?  Visit your health care provider once a year for a well check visit.  Ask your health care provider how often you should have your eyes and teeth  checked.  Stay up to date on all vaccines. This information is not intended to replace advice given to you by your health care provider. Make sure you discuss any questions you have with your health care provider. Document Released: 02/10/2002 Document Revised: 08/26/2018 Document Reviewed: 08/26/2018 Elsevier Patient Education  2020 [employer].

## 2019-08-10 NOTE — Addendum Note (Signed)
Addended by: Anabel Halon on: 08/10/2019 09:57 AM   Modules accepted: Orders

## 2019-08-10 NOTE — Progress Notes (Signed)
Subjective:    Patient ID: Stacey Moore, female    DOB: Mar 07, 1981, 38 y.o.   MRN: [id number]  HPI  Pt in for cpe/wellness.  Work-nurse. Exercise- no Diet- admits poor diet. Vaccine- up to date. Smoker- occasional social smoker. Alcohol-social drinker  Pt had hx of lipoma removed. She states she thinks lipoma may have come back. Was on her left upper back.   Rare occasional pain from prior lipoma site toward her shoulder.  These symptoms for a couple of months.  Also patient mentions some pain below her scapula. Pain off and on for years. Last 2 months more constant pain. Worse with work just recently. Pt stats helping pt transfer will make pain worse. No rash reported.  Also has some potential reflux symptoms. Then stats maybe chest discomfort. Last time had discomfort 4-5 days ago. Lasted bout one hour. No belch. No assocaited with food. Pt does use prilosec regular basis but has used in past. Overall use of prilosec for more than 5 years. Some obvious gerd symptoms in past. Mom hyperliidemia. Dad- no CAD. Siblings no med hx. Pain location left lower rib area.lateral to heart description.  Pt not fasting.   LMP- yesterday.   Review of Systems  Constitutional: Negative for chills.  HENT: Negative for congestion, drooling and ear pain.   Respiratory: Negative for cough, chest tightness, shortness of breath and wheezing.   Cardiovascular: Negative for chest pain and palpitations.  Gastrointestinal: Negative for abdominal pain and blood in stool.  Endocrine: Negative for polydipsia and polyuria.  Genitourinary: Negative for dysuria and enuresis.  Musculoskeletal: Negative for back pain, myalgias and neck pain.  Skin: Negative for rash.  Neurological: Negative for tremors, facial asymmetry, speech difficulty and weakness.  Hematological: Negative for adenopathy. Does not bruise/bleed easily.  Psychiatric/Behavioral: Negative for behavioral problems, dysphoric mood, sleep  disturbance and suicidal ideas.    Past Medical History:  Diagnosis Date  . Allergy   . GERD (gastroesophageal reflux disease)      Social History   Socioeconomic History  . Marital status: Single    Spouse name: Not on file  . Number of children: Not on file  . Years of education: Not on file  . Highest education level: Not on file  Occupational History  . Not on file  Social Needs  . Financial resource strain: Not on file  . Food insecurity    Worry: Not on file    Inability: Not on file  . Transportation needs    Medical: Not on file    Non-medical: Not on file  Tobacco Use  . Smoking status: Light Tobacco Smoker  . Smokeless tobacco: Never Used  . Tobacco comment: socially   Substance and Sexual Activity  . Alcohol use: Yes    Alcohol/week: 2.0 standard drinks    Types: 1 Glasses of wine, 1 Cans of beer per week  . Drug use: No  . Sexual activity: Yes    Birth control/protection: Condom  Lifestyle  . Physical activity    Days per week: Not on file    Minutes per session: Not on file  . Stress: Not on file  Relationships  . Social Herbalist on phone: Not on file    Gets together: Not on file    Attends religious service: Not on file    Active member of club or organization: Not on file    Attends meetings of clubs or organizations: Not on file  Relationship status: Not on file  . Intimate partner violence    Fear of current or ex partner: Not on file    Emotionally abused: Not on file    Physically abused: Not on file    Forced sexual activity: Not on file  Other Topics Concern  . Not on file  Social History Narrative  . Not on file    Past Surgical History:  Procedure Laterality Date  . LIPOMA EXCISION N/A 04/25/2016   Procedure: EXCISION LIPOMA;  Surgeon: Ralene Ok, MD;  Location: [facility];  Service: General;  Laterality: N/A;  . WISDOM TOOTH EXTRACTION      Family History  Problem Relation Age of Onset  . Hyperlipidemia  Mother   . Lung cancer Maternal Grandfather   . Breast cancer Paternal Grandmother        PMP    No Known Allergies  Current Outpatient Medications on File Prior to Visit  Medication Sig Dispense Refill  . omeprazole (PRILOSEC) 20 MG capsule Take 20 mg by mouth daily.    Marland Kitchen PAU D ARCO PO Take by mouth daily.     No current facility-administered medications on file prior to visit.     BP 121/75   Pulse 88   Temp 97.9 F (36.6 C) (Oral)   Resp 16   Ht 5\' 4"  (1.626 m)   Wt 148 lb 3.2 oz (67.2 kg)   SpO2 100%   BMI 25.44 kg/m       Objective:   Physical Exam   General Mental Status- Alert. General Appearance- Not in acute distress.   Skin General: Color- Normal Color. Moisture- Normal Moisture.  Neck Carotid Arteries- Normal color. Moisture- Normal Moisture. No carotid bruits. No JVD.  Chest and Lung Exam Auscultation: Breath Sounds:-Normal.  Cardiovascular Auscultation:Rythm- Regular. Murmurs & Other Heart Sounds:Auscultation of the heart reveals- No Murmurs.  Abdomen Inspection:-Inspeection Normal. Palpation/Percussion:Note:No mass. Palpation and Percussion of the abdomen reveal- Non Tender, Non Distended + BS, no rebound or guarding.   Neurologic Cranial Nerve exam:- CN III-XII intact(No nystagmus), symmetric smile. Strength:- 5/5 equal and symmetric strength both upper and lower extremities.  Skin- left upper back/lower trap area. Area slight cavity on palpation were former lipoma present. No rash on back.  Anterior thorax- no chest wall pain on palpation. Area of discomfort when occurs left lower rib/potential region lateral heart border/serratus anterior region.     Assessment & Plan:  For you wellness exam today I have ordered cbc, cmp and lipid panel.   Vaccine up to date.  Recommend exercise and healthy diet.  We will let you know lab results as they come in.  ekg done today.  Chest xray due to potential rib area pain. Advise take tylenol  short term to see if resolves/helps pain. Short term avoid nsaid due to potential gi side effect.  Counseled on potential referral to GI if gerd flares not controlled due to duration of being on ppi.  Will refer to former surgeon to evalauate if early reoccuring lipoma. Please call back and give me name of former Psychologist, sport and exercise.  Follow up date appointment will be determined after lab review.   Will also add charge [phone] as addressed atypical chest pain, lipoma and gerd hx.    Mackie Pai, PA-C

## 2019-08-10 NOTE — Telephone Encounter (Signed)
Pt seen today

## 2019-10-26 DIAGNOSIS — Z23 Encounter for immunization: Secondary | ICD-10-CM | POA: Diagnosis not present

## 2019-11-16 ENCOUNTER — Other Ambulatory Visit: Payer: Self-pay

## 2019-11-16 ENCOUNTER — Encounter: Payer: Self-pay | Admitting: Certified Nurse Midwife

## 2019-11-16 ENCOUNTER — Ambulatory Visit: Payer: 59 | Admitting: Certified Nurse Midwife

## 2019-11-16 VITALS — BP 120/74 | HR 68 | Temp 97.1°F | Resp 16 | Wt 151.0 lb

## 2019-11-16 DIAGNOSIS — N898 Other specified noninflammatory disorders of vagina: Secondary | ICD-10-CM | POA: Diagnosis not present

## 2019-11-16 DIAGNOSIS — Z113 Encounter for screening for infections with a predominantly sexual mode of transmission: Secondary | ICD-10-CM | POA: Diagnosis not present

## 2019-11-16 NOTE — Progress Notes (Signed)
38 y.o. Single Caucasian female G0P0000 here with complaint of vaginal symptoms of discomfort external occasional burning, and increase white odorous discharge. Onset of symptoms 14 days ago. Denies new personal products. Has STD concerns. Urinary symptoms none . Contraception is condoms inconsistent. Partner change. Also noted ingrown hair in mons area, please check. No soreness just comes and goes. No other health issues today.  Review of Systems  Constitutional: Negative.   HENT: Negative.   Eyes: Negative.   Respiratory: Negative.   Cardiovascular: Negative.   Gastrointestinal: Negative.   Genitourinary: Negative.   Musculoskeletal: Negative.   Skin: Negative.        Vaginal discharge, odor & uncomfortable feeling  Neurological: Negative.   Endo/Heme/Allergies: Negative.   Psychiatric/Behavioral: Negative.     O:Healthy female WDWN Affect: normal, orientation x 3  Exam:Skin: warm and dry Abdomen:soft, non tender, no masses  Inguinal Lymph nodes: no enlargement or tenderness Pelvic exam: External genital: normal female, Mons pubis: small healing scab area with ingrown hair noted on right, no redness or tenderness BUS: negative Vagina: white slightly odorous discharge  Affirm taken Cervix: normal, non tender, no CMT Uterus: normal, non tender Adnexa:normal, non tender, no masses or fullness noted   A:Normal pelvic exam Contraception condoms not consistent Ingrown hair in mons area R/O vaginal infection STD screening   P:Discussed findings of ingrown hair in mons area and etiology. Discussed Epsom salt soak to area and massage. Avoid shaving closely, clip if needed. If not resolving needs to advise. Discussed vaginal discharge finding and possible BV or yeast. Will await affirm and treat as indicated. Discussed Aveeno or baking soda sitz bath for comfort.  Discussed STD prevention with consistent condom use. Lab: GC,Chlamydia, declines serum screening.   Rv prn

## 2019-11-16 NOTE — Patient Instructions (Signed)

## 2019-11-17 LAB — VAGINITIS/VAGINOSIS, DNA PROBE
Candida Species: NEGATIVE
Gardnerella vaginalis: POSITIVE — AB
Trichomonas vaginosis: NEGATIVE

## 2019-11-18 ENCOUNTER — Other Ambulatory Visit: Payer: Self-pay

## 2019-11-18 LAB — GC/CHLAMYDIA PROBE AMP
Chlamydia trachomatis, NAA: NEGATIVE
Neisseria Gonorrhoeae by PCR: NEGATIVE

## 2019-11-18 MED ORDER — METRONIDAZOLE 500 MG PO TABS: 500.0000 mg | ORAL_TABLET | Freq: Two times a day (BID) | ORAL | 0 refills | Status: DC

## 2019-11-18 NOTE — Progress Notes (Signed)
m °

## 2019-11-29 ENCOUNTER — Telehealth: Payer: Self-pay

## 2019-11-29 ENCOUNTER — Telehealth: Payer: Self-pay | Admitting: Medical

## 2019-11-29 DIAGNOSIS — D179 Benign lipomatous neoplasm, unspecified: Secondary | ICD-10-CM

## 2019-11-29 DIAGNOSIS — H5213 Myopia, bilateral: Secondary | ICD-10-CM | POA: Diagnosis not present

## 2019-11-29 NOTE — Telephone Encounter (Signed)
Referral to surgeon placed. 

## 2019-11-29 NOTE — Telephone Encounter (Signed)
Referral to general surgeon placed. 

## 2019-11-29 NOTE — Telephone Encounter (Signed)
Copied from [place] [phone] [phone]. Topic: Referral - Request for Referral >> Nov 29, 2019  9:54 AM Oneta Rack wrote: Patient was advised to call back with the following information, [facility] Dr. De Burrs conducted patient lipoma removal, requesting referral for patient to follow up with specialist

## 2019-12-20 DIAGNOSIS — L853 Xerosis cutis: Secondary | ICD-10-CM | POA: Diagnosis not present

## 2019-12-20 DIAGNOSIS — L570 Actinic keratosis: Secondary | ICD-10-CM | POA: Diagnosis not present

## 2019-12-20 DIAGNOSIS — Z23 Encounter for immunization: Secondary | ICD-10-CM | POA: Diagnosis not present

## 2019-12-20 DIAGNOSIS — L739 Follicular disorder, unspecified: Secondary | ICD-10-CM | POA: Diagnosis not present

## 2019-12-20 DIAGNOSIS — D224 Melanocytic nevi of scalp and neck: Secondary | ICD-10-CM | POA: Diagnosis not present

## 2019-12-20 DIAGNOSIS — L700 Acne vulgaris: Secondary | ICD-10-CM | POA: Diagnosis not present

## 2019-12-30 HISTORY — PX: LIPOMA EXCISION: SHX5283

## 2020-02-20 DIAGNOSIS — D171 Benign lipomatous neoplasm of skin and subcutaneous tissue of trunk: Secondary | ICD-10-CM | POA: Diagnosis not present

## 2020-03-21 ENCOUNTER — Encounter: Payer: Self-pay | Admitting: Certified Nurse Midwife

## 2020-03-23 ENCOUNTER — Ambulatory Visit: Payer: 59 | Admitting: Obstetrics & Gynecology

## 2020-03-23 ENCOUNTER — Encounter: Payer: Self-pay | Admitting: Obstetrics & Gynecology

## 2020-03-23 ENCOUNTER — Other Ambulatory Visit: Payer: Self-pay

## 2020-03-23 ENCOUNTER — Telehealth: Payer: Self-pay | Admitting: Obstetrics & Gynecology

## 2020-03-23 VITALS — BP 110/68 | HR 64 | Temp 97.9°F | Resp 16 | Wt 156.0 lb

## 2020-03-23 DIAGNOSIS — N764 Abscess of vulva: Secondary | ICD-10-CM | POA: Diagnosis not present

## 2020-03-23 MED ORDER — FLUCONAZOLE 150 MG PO TABS: 150.0000 mg | ORAL_TABLET | Freq: Once | ORAL | 0 refills | Status: AC

## 2020-03-23 MED ORDER — SULFAMETHOXAZOLE-TRIMETHOPRIM 800-160 MG PO TABS: 1.0000 | ORAL_TABLET | Freq: Two times a day (BID) | ORAL | 0 refills | Status: DC

## 2020-03-23 NOTE — Telephone Encounter (Signed)
Spoke to pt. Pt states has red, pus coming out of ingrown hair that is not painful, but tender when touched. Pt states has had for 6 months, but now having drainage. Denies fever. Pt scheduled for OV today 3/26 at 1030 am with Dr Sabra Heck. Pt agreeable. CPS Neg  Routing to Dr Sabra Heck for review and will close encounter.

## 2020-03-23 NOTE — Patient Instructions (Signed)
[email or web address]

## 2020-03-23 NOTE — Progress Notes (Signed)
GYNECOLOGY  VISIT  CC:   Draining area  HPI: 39 y.o. G0P0000 Single Caucasian female here for tender, red labial lesion with drainage that started yesterday.  Pt reports she's had this area present for about a month but it's only been just a little red at times.  Then yesterday, when she got out of the shower, she noticed blood and some pus.  It was tender but is a little better today.    GYNECOLOGIC HISTORY: Contraception: condoms occ Menopausal hormone therapy: none  Patient Active Problem List   Diagnosis Date Noted  . Left hip pain 08/04/2016  . 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 GERD (gastroesophageal reflux disease) 07/23/2015  . Allergic rhinitis 07/23/2015    Past Medical History:  Diagnosis Date  . Allergy   . GERD (gastroesophageal reflux disease)     Past Surgical History:  Procedure Laterality Date  . LIPOMA EXCISION N/A 04/25/2016   Procedure: EXCISION LIPOMA;  Surgeon: Ralene Ok, MD;  Location: [place];  Service: General;  Laterality: N/A;  . WISDOM TOOTH EXTRACTION      MEDS:   Current Outpatient Medications on File Prior to Visit  Medication Sig Dispense Refill  . omeprazole (PRILOSEC) 20 MG capsule Take 20 mg by mouth daily.    Marland Kitchen PAU D ARCO PO Take by mouth as needed.      No current facility-administered medications on file prior to visit.    ALLERGIES: Patient has no known allergies.  Family History  Problem Relation Age of Onset  . Hyperlipidemia Mother   . Lung cancer Maternal Grandfather   . Breast cancer Paternal Grandmother        PMP    SH:  Single, social smoker  Review of Systems  Constitutional: Negative.   Skin:       Skin infection, tenderness    PHYSICAL EXAMINATION:   Vitals:   03/23/20 1027  BP: 110/68  Pulse: 64  Resp: 16  Temp: 97.9 F (36.6 C)    General appearance: alert, cooperative and appears stated age  Lymph:  no inguinal LAD noted  Pelvic: External genitalia:  1cm erythematous lesions with purulent appearing  "head" on it located on right mid mons pubis.  This was opened with stick end of q tip.  With compression, purulent drainage noted.  Could not be probed.  Culture obtained.              Chaperone, Danie Binder, CMA, was present for exam.  Assessment: Mons pubis abscess, opened today in office  Plan: Culture obtained Bactrim DS BID x 5 days to pharmacy.  Pt knows to call if erythema is extending.

## 2020-03-23 NOTE — Telephone Encounter (Signed)
Patient has an ingrown hair in her pubic area. It is red and has pus coming out.

## 2020-03-26 LAB — WOUND CULTURE: Organism ID, Bacteria: NONE SEEN

## 2020-04-26 ENCOUNTER — Encounter: Payer: Self-pay | Admitting: Medical

## 2020-04-26 ENCOUNTER — Other Ambulatory Visit: Payer: Self-pay

## 2020-04-26 ENCOUNTER — Ambulatory Visit (HOSPITAL_BASED_OUTPATIENT_CLINIC_OR_DEPARTMENT_OTHER)
Admission: RE | Admit: 2020-04-26 | Discharge: 2020-04-26 | Disposition: A | Discharge status: Home / Self Care | Payer: 59 | Source: Ambulatory Visit | Attending: Medical | Admitting: Medical

## 2020-04-26 ENCOUNTER — Ambulatory Visit: Payer: 59 | Admitting: Medical

## 2020-04-26 VITALS — BP 138/85 | HR 80 | Temp 98.3°F | Resp 18 | Ht 64.0 in | Wt 155.6 lb

## 2020-04-26 DIAGNOSIS — R4589 Other symptoms and signs involving emotional state: Secondary | ICD-10-CM

## 2020-04-26 DIAGNOSIS — R202 Paresthesia of skin: Secondary | ICD-10-CM | POA: Diagnosis not present

## 2020-04-26 DIAGNOSIS — R5383 Other fatigue: Secondary | ICD-10-CM | POA: Diagnosis not present

## 2020-04-26 DIAGNOSIS — D170 Benign lipomatous neoplasm of skin and subcutaneous tissue of head, face and neck: Secondary | ICD-10-CM | POA: Diagnosis not present

## 2020-04-26 DIAGNOSIS — M542 Cervicalgia: Secondary | ICD-10-CM | POA: Diagnosis not present

## 2020-04-26 DIAGNOSIS — G629 Polyneuropathy, unspecified: Secondary | ICD-10-CM | POA: Diagnosis not present

## 2020-04-26 LAB — COMPREHENSIVE METABOLIC PANEL
ALT: 15 U/L (ref 0–35)
AST: 18 U/L (ref 0–37)
Albumin: 4.2 g/dL (ref 3.5–5.2)
Alkaline Phosphatase: 68 U/L (ref 39–117)
BUN: 16 mg/dL (ref 6–23)
CO2: 28 mEq/L (ref 19–32)
Calcium: 8.8 mg/dL (ref 8.4–10.5)
Chloride: 102 mEq/L (ref 96–112)
Creatinine, Ser: 0.74 mg/dL (ref 0.40–1.20)
GFR: 88.45 mL/min (ref >60.00)
Glucose, Bld: 77 mg/dL (ref 70–99)
Potassium: 4 mEq/L (ref 3.5–5.1)
Sodium: 137 mEq/L (ref 135–145)
Total Bilirubin: 0.7 mg/dL (ref 0.2–1.2)
Total Protein: 6.4 g/dL (ref 6.0–8.3)

## 2020-04-26 LAB — CBC WITH DIFFERENTIAL/PLATELET
Basophils Absolute: 0 10*3/uL (ref 0.0–0.1)
Basophils Relative: 0.8 % (ref 0.0–3.0)
Eosinophils Absolute: 0.3 10*3/uL (ref 0.0–0.7)
Eosinophils Relative: 3.8 % (ref 0.0–5.0)
HCT: 34.9 % — ABNORMAL LOW (ref 36.0–46.0)
Hemoglobin: 11.7 g/dL — ABNORMAL LOW (ref 12.0–15.0)
Lymphocytes Relative: 28.5 % (ref 12.0–46.0)
Lymphs Abs: 1.9 10*3/uL (ref 0.7–4.0)
MCHC: 33.6 g/dL (ref 30.0–36.0)
MCV: 102.7 fl — ABNORMAL HIGH (ref 78.0–100.0)
Monocytes Absolute: 0.3 10*3/uL (ref 0.1–1.0)
Monocytes Relative: 4.9 % (ref 3.0–12.0)
Neutro Abs: 4.1 10*3/uL (ref 1.4–7.7)
Neutrophils Relative %: 62 % (ref 43.0–77.0)
Platelets: 278 10*3/uL (ref 150.0–400.0)
RBC: 3.4 Mil/uL — ABNORMAL LOW (ref 3.87–5.11)
RDW: 15.7 % — ABNORMAL HIGH (ref 11.5–15.5)
WBC: 6.6 10*3/uL (ref 4.0–10.5)

## 2020-04-26 LAB — TSH: TSH: 3.12 u[IU]/mL (ref 0.35–4.50)

## 2020-04-26 LAB — VITAMIN B12: Vitamin B-12: 90 pg/mL — ABNORMAL LOW (ref 211–911)

## 2020-04-26 NOTE — Progress Notes (Signed)
Subjective:    Patient ID: Stacey Moore, female    DOB: [birth year], 39 y.o.   MRN: [id number]  HPI    Pt in for evaluation of both hands feeling numb and tingly for about one week. Pt states it is constant. No neck pain. During sleep or waking up is not more prevalent. Low level constant sensation. No stating joint pain.  States 3 weeks ago fell out of bed hit back of her head on night stand. But no loc or gross motor or sensory function deficits reported.  Pt had know lipoma back of neck. Pt may get removed in surgeon office or at hospital but will need to be off work for about 2 weeks.   LMP- on presently.    Review of Systems  Constitutional: Positive for fatigue. Negative for chills and fever.  Respiratory: Negative for chest tightness, shortness of breath and wheezing.   Cardiovascular: Negative for chest pain and palpitations.  Gastrointestinal: Negative for abdominal pain.  Musculoskeletal: Negative for back pain.       See hpi.  Neurological:       See hpi.  Hematological: Negative for adenopathy. Does not bruise/bleed easily.  Psychiatric/Behavioral: Positive for dysphoric mood. Negative for behavioral problems. The patient is not nervous/anxious.        Recently more stressed and some depression. Related to work. States challenging work environment.    Pt wants to hold off presently with medications.    Past Medical History:  Diagnosis Date  . Allergy   . GERD (gastroesophageal reflux disease)      Social History   Socioeconomic History  . Marital status: Single    Spouse name: Not on file  . Number of children: Not on file  . Years of education: Not on file  . Highest education level: Not on file  Occupational History  . Not on file  Tobacco Use  . Smoking status: Light Tobacco Smoker  . Smokeless tobacco: Never Used  . Tobacco comment: socially   Substance and Sexual Activity  . Alcohol use: Yes    Comment: occ  . Drug use: No  . Sexual  activity: Yes    Partners: Male    Birth control/protection: Condom    Comment: condoms occ  Other Topics Concern  . Not on file  Social History Narrative  . Not on file   Social Determinants of Health   Financial Resource Strain:   . Difficulty of Paying Living Expenses:   Food Insecurity:   . Worried About Charity fundraiser in the Last Year:   . Arboriculturist in the Last Year:   Transportation Needs:   . Film/video editor (Medical):   Marland Kitchen Lack of Transportation (Non-Medical):   Physical Activity:   . Days of Exercise per Week:   . Minutes of Exercise per Session:   Stress:   . Feeling of Stress :   Social Connections:   . Frequency of Communication with Friends and Family:   . Frequency of Social Gatherings with Friends and Family:   . Attends Religious Services:   . Active Member of Clubs or Organizations:   . Attends Archivist Meetings:   Marland Kitchen Marital Status:   Intimate Partner Violence:   . Fear of Current or Ex-Partner:   . Emotionally Abused:   Marland Kitchen Physically Abused:   . Sexually Abused:     Past Surgical History:  Procedure Laterality Date  . LIPOMA EXCISION N/A  04/25/2016   Procedure: EXCISION LIPOMA;  Surgeon: Ralene Ok, MD;  Location[facility];  Service: General;  Laterality: N/A;  . WISDOM TOOTH EXTRACTION      Family History  Problem Relation Age of Onset  . Hyperlipidemia Mother   . Lung cancer Maternal Grandfather   . Breast cancer Paternal Grandmother        PMP    No Known Allergies  Current Outpatient Medications on File Prior to Visit  Medication Sig Dispense Refill  . omeprazole (PRILOSEC) 20 MG capsule Take 20 mg by mouth daily.    Marland Kitchen PAU D ARCO PO Take by mouth as needed.     . sulfamethoxazole-trimethoprim (BACTRIM DS) 800-160 MG tablet Take 1 tablet by mouth 2 (two) times daily. (Patient not taking: Reported on 04/26/2020) 10 tablet 0   No current facility-administered medications on file prior to visit.    BP 139/86  (BP Location: Left Arm, Patient Position: Sitting, Cuff Size: Large)   Pulse 80   Temp 98.3 F (36.8 C) (Temporal)   Resp 18   Ht 5\' 4"  (1.626 m)   Wt 155 lb 9.6 oz (70.6 kg)   SpO2 100%   BMI 26.71 kg/m       Objective:   Physical Exam   General Mental Status- Alert. General Appearance- Not in acute distress.   Skin General: Color- Normal Color. Moisture- Normal Moisture.  Neck Carotid Arteries- Normal color. Moisture- Normal Moisture. No carotid bruits. No JVD. No mid c spine pain but pain over left trap over lipoma.  Chest and Lung Exam Auscultation: Breath Sounds:-Normal.  Cardiovascular Auscultation:Rythm- Regular. Murmurs & Other Heart Sounds:Auscultation of the heart reveals- No Murmurs.  Abdomen Inspection:-Inspeection Normal. Palpation/Percussion:Note:No mass. Palpation and Percussion of the abdomen reveal- Non Tender, Non Distended + BS, no rebound or guarding.    Neurologic Cranial Nerve exam:- CN III-XII intact(No nystagmus), symmetric smile. Strength:- 5/5 equal and symmetric strength both upper and lower extremities.  Hands- equal and symmetric grip strength.  Wrist- negative phalens sign bilateral.        Assessment & Plan:  You do have some paresthesia/neuropathy type symptoms which might represent carpal tunnel syndrome.  I would recommend low dose ibuprofen 400 mg every 8 hours and use wrist cock-up splint over-the-counter on left for right wrist.  Your resting over the next 6 days/not working so we will see if this helps.  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  We will get cervical spine x-ray and assess area for possible indication of radicular type pain.  Considering getting lipoma surgery.  2 weeks of rash/not working might be helpful in the event of carpal tunnel syndrome.  For recent fatigue and neuropathy type complaints, did place order for TSH, B12, B1, CBC and CMP.  Recent mild  depressed mood related to stress.  Use of medications discussed today and presently declined.  If you feel mood worsening and would like to try medication please let me know.  Follow-up in 2 to 3 weeks or as needed.  Time spent with patient today was 30   minutes which consisted of chart review, discussing  differnetial diagnosis, work up, treatment and documentation.

## 2020-04-26 NOTE — Patient Instructions (Addendum)
You do have some paresthesia/neuropathy type symptoms which might represent carpal tunnel syndrome.  I would recommend low dose ibuprofen 400 mg every 8 hours and use wrist cock-up splint over-the-counter on left for right wrist.  Your resting over the next 6 days/not working so we will see if this helps.  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  We will get cervical spine x-ray and assess area for possible indication of radicular type pain.  Considering getting lipoma surgery.  2 weeks of rash/not working might be helpful in the event of carpal tunnel syndrome.  For recent fatigue and neuropathy type complaints, did place order for TSH, B12, B1, CBC and CMP.  Recent mild depressed mood related to stress.  Use of medications discussed today and presently declined.  If you feel mood worsening and would like to try medication please let me know.  Follow-up in 2 to 3 weeks or as needed.

## 2020-04-27 ENCOUNTER — Other Ambulatory Visit: Payer: 59

## 2020-04-27 DIAGNOSIS — D649 Anemia, unspecified: Secondary | ICD-10-CM

## 2020-04-28 LAB — IRON, TOTAL/TOTAL IRON BINDING CAP
%SAT: 12 % (calc) — ABNORMAL LOW (ref 16–45)
Iron: 44 ug/dL (ref 40–190)
TIBC: 355 mcg/dL (calc) (ref 250–450)

## 2020-05-01 LAB — VITAMIN B1: Vitamin B1 (Thiamine): 11 nmol/L (ref 8–30)

## 2020-05-03 ENCOUNTER — Encounter: Payer: Self-pay | Admitting: Medical

## 2020-05-09 ENCOUNTER — Other Ambulatory Visit: Payer: Self-pay

## 2020-05-09 ENCOUNTER — Ambulatory Visit (INDEPENDENT_AMBULATORY_CARE_PROVIDER_SITE_OTHER): Payer: 59

## 2020-05-09 DIAGNOSIS — E538 Deficiency of other specified B group vitamins: Secondary | ICD-10-CM | POA: Diagnosis not present

## 2020-05-09 MED ORDER — CYANOCOBALAMIN 1000 MCG/ML IJ SOLN
1000.0000 ug | Freq: Once | INTRAMUSCULAR | Status: AC
  Administered 2020-05-09: 1000 ug via INTRAMUSCULAR

## 2020-05-09 NOTE — Progress Notes (Addendum)
Pt here for monthly B12 injection per Mackie Pai PA  B12 1054mcg given IM, and pt tolerated injection well.  Next scheduled for 06/12/2020  Agree with administration of b12 for low b12.  Mackie Pai, PA-C

## 2020-05-17 ENCOUNTER — Ambulatory Visit: Payer: 59 | Admitting: Medical

## 2020-06-12 ENCOUNTER — Ambulatory Visit (INDEPENDENT_AMBULATORY_CARE_PROVIDER_SITE_OTHER): Payer: 59

## 2020-06-12 ENCOUNTER — Other Ambulatory Visit: Payer: Self-pay

## 2020-06-12 DIAGNOSIS — E538 Deficiency of other specified B group vitamins: Secondary | ICD-10-CM | POA: Diagnosis not present

## 2020-06-12 MED ORDER — CYANOCOBALAMIN 1000 MCG/ML IJ SOLN
1000.0000 ug | Freq: Once | INTRAMUSCULAR | Status: AC
  Administered 2020-06-12: 1000 ug via INTRAMUSCULAR

## 2020-06-12 NOTE — Progress Notes (Addendum)
Pt here for monthly B12 injection per  Mackie Pai  B12 1053mcg given IM in right deltoid, and pt tolerated injection well.  Next B12 injection scheduled for next month.   Agree with administration of b12 for b12 deficiency.  Mackie Pai, PA-C

## 2020-06-29 ENCOUNTER — Ambulatory Visit: Payer: 59 | Admitting: Medical

## 2020-07-13 ENCOUNTER — Ambulatory Visit: Payer: 59

## 2020-07-20 ENCOUNTER — Ambulatory Visit: Payer: 59 | Admitting: Medical

## 2020-07-20 ENCOUNTER — Other Ambulatory Visit: Payer: Self-pay

## 2020-07-20 ENCOUNTER — Telehealth: Payer: Self-pay | Admitting: Medical

## 2020-07-20 ENCOUNTER — Encounter: Payer: Self-pay | Admitting: Neurology

## 2020-07-20 VITALS — BP 112/72 | HR 83 | Resp 18 | Ht 66.0 in | Wt 157.0 lb

## 2020-07-20 DIAGNOSIS — E538 Deficiency of other specified B group vitamins: Secondary | ICD-10-CM

## 2020-07-20 DIAGNOSIS — D649 Anemia, unspecified: Secondary | ICD-10-CM | POA: Diagnosis not present

## 2020-07-20 DIAGNOSIS — G629 Polyneuropathy, unspecified: Secondary | ICD-10-CM | POA: Diagnosis not present

## 2020-07-20 LAB — CBC WITH DIFFERENTIAL/PLATELET
Basophils Absolute: 0 10*3/uL (ref 0.0–0.1)
Basophils Relative: 0.4 % (ref 0.0–3.0)
Eosinophils Absolute: 0.2 10*3/uL (ref 0.0–0.7)
Eosinophils Relative: 2.3 % (ref 0.0–5.0)
HCT: 36.4 % (ref 36.0–46.0)
Hemoglobin: 12.4 g/dL (ref 12.0–15.0)
Lymphocytes Relative: 20.4 % (ref 12.0–46.0)
Lymphs Abs: 1.5 10*3/uL (ref 0.7–4.0)
MCHC: 33.9 g/dL (ref 30.0–36.0)
MCV: 97.7 fl (ref 78.0–100.0)
Monocytes Absolute: 0.4 10*3/uL (ref 0.1–1.0)
Monocytes Relative: 6 % (ref 3.0–12.0)
Neutro Abs: 5.3 10*3/uL (ref 1.4–7.7)
Neutrophils Relative %: 70.9 % (ref 43.0–77.0)
Platelets: 295 10*3/uL (ref 150.0–400.0)
RBC: 3.73 Mil/uL — ABNORMAL LOW (ref 3.87–5.11)
RDW: 13.1 % (ref 11.5–15.5)
WBC: 7.5 10*3/uL (ref 4.0–10.5)

## 2020-07-20 LAB — IRON: Iron: 44 ug/dL (ref 42–145)

## 2020-07-20 LAB — VITAMIN B12: Vitamin B-12: 139 pg/mL — ABNORMAL LOW (ref 211–911)

## 2020-07-20 MED ORDER — CYANOCOBALAMIN 1000 MCG/ML IJ SOLN
1000.0000 ug | Freq: Once | INTRAMUSCULAR | Status: AC
Start: 2020-07-20 — End: 2020-07-20
  Administered 2020-07-20: 1000 ug via INTRAMUSCULAR

## 2020-07-20 NOTE — Progress Notes (Signed)
Subjective:    Patient ID: Stacey Moore, female    DOB: [birth year], 39 y.o.   MRN: [id number]  HPI  Pt in for follow up.  In April she had some neuropathy type symptoms. Her b12 was low at that time. Pt has been on b12 shots.   Pt states in May she had some upper extremity numbness and tingling. This was all the time.  No neck pain.   Some tingling of lower ext still. No low back pain.  Pt states some improvement with b12 but still some residual symptoms. But end of may had severe neuropathy and tingling type symptoms.  No muscles weakness. No dizziness or balance issues. No eye pain or blurred vision.  lmp- one week ago.   Review of Systems  Constitutional: Negative for chills, fatigue and fever.  Respiratory: Negative for cough, chest tightness, shortness of breath and wheezing.   Cardiovascular: Negative for chest pain and palpitations.  Gastrointestinal: Negative for abdominal pain.  Neurological:       See hpi.   Hematological: Negative for adenopathy. Does not bruise/bleed easily.    Past Medical History:  Diagnosis Date  . Allergy   . GERD (gastroesophageal reflux disease)      Social History   Socioeconomic History  . Marital status: Single    Spouse name: Not on file  . Number of children: Not on file  . Years of education: Not on file  . Highest education level: Not on file  Occupational History  . Not on file  Tobacco Use  . Smoking status: Light Tobacco Smoker  . Smokeless tobacco: Never Used  . Tobacco comment: socially   Vaping Use  . Vaping Use: Never used  Substance and Sexual Activity  . Alcohol use: Yes    Comment: occ  . Drug use: No  . Sexual activity: Yes    Partners: Male    Birth control/protection: Condom    Comment: condoms occ  Other Topics Concern  . Not on file  Social History Narrative  . Not on file   Social Determinants of Health   Financial Resource Strain:   . Difficulty of Paying Living Expenses:   Food  Insecurity:   . Worried About Charity fundraiser in the Last Year:   . Arboriculturist in the Last Year:   Transportation Needs:   . Film/video editor (Medical):   Marland Kitchen Lack of Transportation (Non-Medical):   Physical Activity:   . Days of Exercise per Week:   . Minutes of Exercise per Session:   Stress:   . Feeling of Stress :   Social Connections:   . Frequency of Communication with Friends and Family:   . Frequency of Social Gatherings with Friends and Family:   . Attends Religious Services:   . Active Member of Clubs or Organizations:   . Attends Archivist Meetings:   Marland Kitchen Marital Status:   Intimate Partner Violence:   . Fear of Current or Ex-Partner:   . Emotionally Abused:   Marland Kitchen Physically Abused:   . Sexually Abused:     Past Surgical History:  Procedure Laterality Date  . LIPOMA EXCISION N/A 04/25/2016   Procedure: EXCISION LIPOMA;  Surgeon: Ralene Ok, MD;  Location: [facility] OR;  Service: General;  Laterality: N/A;  . WISDOM TOOTH EXTRACTION      Family History  Problem Relation Age of Onset  . Hyperlipidemia Mother   . Lung cancer Maternal Grandfather   .  Breast cancer Paternal Grandmother        PMP    No Known Allergies  Current Outpatient Medications on File Prior to Visit  Medication Sig Dispense Refill  . omeprazole (PRILOSEC) 20 MG capsule Take 20 mg by mouth daily.    Marland Kitchen PAU D ARCO PO Take by mouth as needed.  (Patient not taking: Reported on 07/20/2020)    . sulfamethoxazole-trimethoprim (BACTRIM DS) 800-160 MG tablet Take 1 tablet by mouth 2 (two) times daily. (Patient not taking: Reported on 04/26/2020) 10 tablet 0   No current facility-administered medications on file prior to visit.    BP 112/72 (BP Location: Right Arm, Patient Position: Sitting, Cuff Size: Large)   Pulse 83   Resp 18   Ht 5\' 6"  (1.676 m)   Wt 157 lb (71.2 kg)   LMP 07/12/2020 (Approximate)   SpO2 98%   BMI 25.34 kg/m       Objective:   Physical  Exam  General Mental Status- Alert. General Appearance- Not in acute distress.   Skin General: Color- Normal Color. Moisture- Normal Moisture.  Neck Carotid Arteries- Normal color. Moisture- Normal Moisture. No carotid bruits. No JVD.  Chest and Lung Exam Auscultation: Breath Sounds:-Normal.  Cardiovascular Auscultation:Rythm- Regular. Murmurs & Other Heart Sounds:Auscultation of the heart reveals- No Murmurs.  Abdomen Inspection:-Inspeection Normal. Palpation/Percussion:Note:No mass. Palpation and Percussion of the abdomen reveal- Non Tender, Non Distended + BS, no rebound or guarding.    Neurologic Cranial Nerve exam:- CN III-XII intact(No nystagmus), symmetric smile. Drift Test:- No drift. Romberg Exam:- Negative.  Heal to Toe Gait exam:-Normal. Finger to Nose:- Normal/Intact Strength:- 5/5 equal and symmetric strength both upper and lower extremities.  Upper and lower ext- monfilament sensation intact. Upper ext- neg phalens sign.     Assessment & Plan:  For history of low b12 and neuropathy we gave you b12 injection today. Will get repeat b12 level today.   Referral to neurologist to work up further at your request.  For anemia history will repeat cbc and get iron level.  Follow up date to be determined after lab review.  Mackie Pai, PA-C   Time spent with patient today was 25 minutes which consisted of chart review, discussing diagnosis, work up,  Treatment, referral  and documentation.

## 2020-07-20 NOTE — Telephone Encounter (Signed)
After lab review decided to cancel pt neurology referral. Would you send me confirmation message canceled. Thanks.

## 2020-07-20 NOTE — Patient Instructions (Signed)
For history of low b12 and neuropathy we gave you b12 injection today. Will get repeat b12 level today.   Referral to neurologist to work up further at your request.  For anemia history will repeat cbc and get iron level.  Follow up date to be determined after lab review.

## 2020-07-22 ENCOUNTER — Telehealth: Payer: Self-pay | Admitting: Medical

## 2020-07-22 NOTE — Telephone Encounter (Signed)
Please cancel the referral to neurologist. Decided on cancelling after lab review.  Please send me message confirming it was cancelled.

## 2020-07-23 NOTE — Progress Notes (Signed)
39 y.o. G0P0000 Single White female here for annual exam. Patient complains of having irritation at the anus and occasional blood on tissue when wiping after a bowel movement.  Blood is pink when she sees it on the tissue.  Cycles are regular.  Using condoms for contraception.  With on and off partner.  Would like STD testing today.  Has some vaginitis issues.  Would like to be tested for yeast/BV today as well.    Was having numbness and tingling in fingers and toes.  B12 deficiency noted.  Having monthly injections that now weekly injections.  B12 is better but not normal yet.  Will have this recheck again in about three weeks.    Patient's last menstrual period was 07/12/2020 (approximate).          Sexually active: Yes.    The current method of family planning is condoms most of the time.    Exercising: No.  The patient does not participate in regular exercise at present. Smoker:  yes  Health Maintenance: Pap:  12-30-17 neg, 03-07-2019 neg History of abnormal Pap:  no TDaP:  2019 Hep C testing: neg 2019 Screening Labs: PCP   reports that she has been smoking. She has never used smokeless tobacco. She reports current alcohol use. She reports that she does not use drugs.  Past Medical History:  Diagnosis Date  . Allergy   . GERD (gastroesophageal reflux disease)     Past Surgical History:  Procedure Laterality Date  . LIPOMA EXCISION N/A 04/25/2016   Procedure: EXCISION LIPOMA;  Surgeon: Ralene Ok, MD;  Location: [place];  Service: General;  Laterality: N/A;  . WISDOM TOOTH EXTRACTION      Current Outpatient Medications  Medication Sig Dispense Refill  . NON FORMULARY Lion's Mane - prn    . omeprazole (PRILOSEC) 20 MG capsule Take 20 mg by mouth daily.    Marland Kitchen PAU D ARCO PO Take by mouth as needed.      No current facility-administered medications for this visit.    Family History  Problem Relation Age of Onset  . Hyperlipidemia Mother   . Lung cancer Maternal  Grandfather   . Breast cancer Paternal Grandmother        PMP    Review of Systems  All other systems reviewed and are negative.   Exam:   BP 120/72 (BP Location: Right Arm, Patient Position: Sitting, Cuff Size: Normal)   Pulse 68   Resp 12   Ht 5\' 4"  (1.626 m)   Wt 156 lb (70.8 kg)   LMP 07/12/2020 (Approximate)   BMI 26.78 kg/m   Height: 5\' 4"  (162.6 cm)  General appearance: alert, cooperative and appears stated age Head: Normocephalic, without obvious abnormality, atraumatic Neck: no adenopathy, supple, symmetrical, trachea midline and thyroid normal to inspection and palpation Lungs: clear to auscultation bilaterally Breasts: normal appearance, no masses or tenderness Heart: regular rate and rhythm Abdomen: soft, non-tender; bowel sounds normal; no masses,  no organomegaly Extremities: extremities normal, atraumatic, no cyanosis or edema Skin: Skin color, texture, turgor normal. No rashes or lesions Lymph nodes: Cervical, supraclavicular, and axillary nodes normal. No abnormal inguinal nodes palpated Neurologic: Grossly normal   Pelvic: External genitalia:  no lesions              Urethra:  normal appearing urethra with no masses, tenderness or lesions              Bartholins and Skenes: normal  Vagina: normal appearing vagina with normal color and discharge, no lesions              Cervix: no lesions              Pap taken: Yes.   Bimanual Exam:  Uterus:  normal size, contour, position, consistency, mobility, non-tender              Adnexa: normal adnexa and no mass, fullness, tenderness               Rectovaginal: Confirms               Anus:  normal sphincter tone, three perirectal lesion noted, ulcerated and tender  Chaperone, Terence Lux, CMA, was present for exam.  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  Will plan to start at age 61. pap smear with HR HPV obtained today.   Vaginitis testing obtained today HIV, RPR, and Hep C obtained today,  GC/Chl/trich also obtained. Valtrex 1gram bid x 10 days.  rx to pharamcy. HSV PCR testing obtained today Return annually or prn

## 2020-07-23 NOTE — Telephone Encounter (Signed)
Thanks

## 2020-07-23 NOTE — Telephone Encounter (Signed)
Done

## 2020-07-24 ENCOUNTER — Encounter: Payer: Self-pay | Admitting: Obstetrics & Gynecology

## 2020-07-24 ENCOUNTER — Other Ambulatory Visit (HOSPITAL_COMMUNITY)
Admission: RE | Admit: 2020-07-24 | Discharge: 2020-07-24 | Disposition: A | Discharge status: Home / Self Care | Payer: 59 | Source: Ambulatory Visit | Attending: Obstetrics & Gynecology | Admitting: Obstetrics & Gynecology

## 2020-07-24 ENCOUNTER — Ambulatory Visit: Payer: 59 | Admitting: Obstetrics & Gynecology

## 2020-07-24 ENCOUNTER — Ambulatory Visit (INDEPENDENT_AMBULATORY_CARE_PROVIDER_SITE_OTHER): Payer: 59 | Admitting: Obstetrics & Gynecology

## 2020-07-24 ENCOUNTER — Other Ambulatory Visit: Payer: Self-pay

## 2020-07-24 VITALS — BP 120/72 | HR 68 | Resp 12 | Ht 64.0 in | Wt 156.0 lb

## 2020-07-24 DIAGNOSIS — Z113 Encounter for screening for infections with a predominantly sexual mode of transmission: Secondary | ICD-10-CM | POA: Insufficient documentation

## 2020-07-24 DIAGNOSIS — Z01419 Encounter for gynecological examination (general) (routine) without abnormal findings: Secondary | ICD-10-CM | POA: Diagnosis not present

## 2020-07-24 DIAGNOSIS — N766 Ulceration of vulva: Secondary | ICD-10-CM

## 2020-07-24 DIAGNOSIS — E538 Deficiency of other specified B group vitamins: Secondary | ICD-10-CM

## 2020-07-24 DIAGNOSIS — Z124 Encounter for screening for malignant neoplasm of cervix: Secondary | ICD-10-CM | POA: Insufficient documentation

## 2020-07-24 MED ORDER — VALACYCLOVIR HCL 1 G PO TABS
1000.0000 mg | ORAL_TABLET | Freq: Two times a day (BID) | ORAL | 0 refills | Status: DC
Start: 2020-07-24 — End: 2020-07-31

## 2020-07-25 LAB — FOLATE: Folate: 4.4 ng/mL (ref >3.0)

## 2020-07-25 LAB — HIV ANTIBODY (ROUTINE TESTING W REFLEX): HIV Screen 4th Generation wRfx: NONREACTIVE

## 2020-07-25 LAB — CYTOLOGY - PAP
Chlamydia: NEGATIVE
Comment: NEGATIVE
Comment: NEGATIVE
Comment: NEGATIVE
Comment: NORMAL
Diagnosis: NEGATIVE
High risk HPV: NEGATIVE
Neisseria Gonorrhea: NEGATIVE
Trichomonas: NEGATIVE

## 2020-07-25 LAB — RPR: RPR Ser Ql: NONREACTIVE

## 2020-07-25 LAB — HEPATITIS C ANTIBODY: Hep C Virus Ab: <0.1 s/co ratio (ref 0.0–0.9)

## 2020-07-26 LAB — HSV DNA BY PCR (REFERENCE LAB)
HSV 2 DNA: NEGATIVE
HSV-1 DNA: NEGATIVE

## 2020-07-27 ENCOUNTER — Telehealth: Payer: Self-pay | Admitting: *Deleted

## 2020-07-27 ENCOUNTER — Other Ambulatory Visit: Payer: Self-pay

## 2020-07-27 ENCOUNTER — Ambulatory Visit (INDEPENDENT_AMBULATORY_CARE_PROVIDER_SITE_OTHER): Payer: 59 | Admitting: *Deleted

## 2020-07-27 DIAGNOSIS — E538 Deficiency of other specified B group vitamins: Secondary | ICD-10-CM

## 2020-07-27 MED ORDER — CYANOCOBALAMIN 1000 MCG/ML IJ SOLN
1000.0000 ug | Freq: Once | INTRAMUSCULAR | Status: AC
  Administered 2020-07-27: 1000 ug via INTRAMUSCULAR

## 2020-07-27 MED ORDER — CYANOCOBALAMIN 1000 MCG/ML IJ SOLN
INTRAMUSCULAR | 0 refills | Status: DC
Start: 2020-07-27 — End: 2020-08-20

## 2020-07-27 MED ORDER — "LUER LOCK SAFETY SYRINGES 25G X 1"" 3 ML MISC": 0 refills | Status: DC

## 2020-07-27 NOTE — Progress Notes (Addendum)
Patient here for b12 injection per Percell Miller.  He recommended weekly b12 injections over the next  4 week sand then recheck in one month.  This will be her first weekly injection.   Injection given in right deltoid and patient tolerated well.  Patient would like for prescription of b12 sent into pharmacy for her next 2 injections.  She will do at home.  Mackie Pai, PA-C

## 2020-07-27 NOTE — Telephone Encounter (Signed)
If you load b12 rx with supplies will sign. Thanks for your help.

## 2020-07-27 NOTE — Telephone Encounter (Signed)
Patient came in for b12 injection.  You recommended that she 3 weekly injections and then recheck in 1 month. She would like to do her next 2 weekly injections at home due to her schedule and her going on vacation. Can you send in rx and syringes?

## 2020-07-27 NOTE — Telephone Encounter (Signed)
Patient notified that rxs have been sent in. 

## 2020-07-30 NOTE — Progress Notes (Signed)
GYNECOLOGY  VISIT  CC:   Follow up/recheck  HPI: 39 y.o. G0P0000 Single Unavailable female here for follow up after being seen last week for perirectal lesions that she is aware I thought was HSV.  She did start valtrex.  HSV PCR testing was obtained from lesions.  This was negative.  Pt does have possible prior exposure.  She has neg HSV IGG 1/2 testing.  Have recommended repeating this because if negative, then these lesions were definitely not HSV.  Pt aware other STD testing was negative.  We discussed other possible causes.  If lesion(s) are still open, I am going to do a skin culture as well.    Separately, she had a bee sting on the left side of her neck on Sunday.  It was really swollen then.  It is better but there is still swelling and some erythema.  She has been using topical steroid.  Wants to know what else she should do.  She does have picture from Sunday for comparison to today.  GYNECOLOGIC HISTORY: Patient's last menstrual period was 07/12/2020 (approximate). Contraception: condoms most of the time Menopausal hormone therapy: none  Patient Active Problem List   Diagnosis Date Noted  . Left hip pain 08/04/2016  . 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 GERD (gastroesophageal reflux disease) 07/23/2015  . Allergic rhinitis 07/23/2015    Past Medical History:  Diagnosis Date  . Allergy   . GERD (gastroesophageal reflux disease)     Past Surgical History:  Procedure Laterality Date  . LIPOMA EXCISION N/A 04/25/2016   Procedure: EXCISION LIPOMA;  Surgeon: Ralene Ok, MD;  Location: [place];  Service: General;  Laterality: N/A;  . WISDOM TOOTH EXTRACTION      MEDS:   Current Outpatient Medications on File Prior to Visit  Medication Sig Dispense Refill  . cyanocobalamin (,VITAMIN B-12,) 1000 MCG/ML injection Inject 67ml IM weekly for 2 weeks 2 mL 0  . NON FORMULARY [facility] Mane - prn    . omeprazole (PRILOSEC) 20 MG capsule Take 20 mg by mouth daily.    Marland Kitchen PAU D ARCO PO Take by mouth  as needed.     . SYRINGE-NEEDLE, DISP, 3 ML (LUER LOCK SAFETY SYRINGES) 25G X 1" 3 ML MISC Use as directed with b12 2 each 0   No current facility-administered medications on file prior to visit.    ALLERGIES: Patient has no known allergies.  Family History  Problem Relation Age of Onset  . Hyperlipidemia Mother   . Lung cancer Maternal Grandfather   . Breast cancer Paternal Grandmother        PMP, diagnosis in her 64s?    SH:  Single, non smoker  Review of Systems  Constitutional: Negative.   HENT: Negative.   Eyes: Negative.   Respiratory: Negative.   Cardiovascular: Negative.   Gastrointestinal: Negative.   Endocrine: Negative.   Genitourinary:       Irritation  Musculoskeletal: Negative.   Skin: Negative.   Allergic/Immunologic: Negative.   Neurological: Negative.   Hematological: Negative.   Psychiatric/Behavioral: Negative.     PHYSICAL EXAMINATION:    BP 110/72   Pulse 70   Resp 16   Wt 157 lb (71.2 kg)   LMP 07/12/2020 (Approximate)   BMI 26.95 kg/m     General appearance: alert, cooperative and appears stated age Neck:  About 4cm area of erythema with mild edema (compared to picture from Sunday there is less erythema and less edema Lymph:  no inguinal LAD noted  Pelvic: External genitalia:  no lesions              Urethra:  normal appearing urethra with no masses, tenderness or lesions              Bartholins and Skenes: small non tender bartholin's cyst on the right, about side of small marble                 Vagina: normal appearing vagina with normal color and discharge, no lesions               Anus: no lesions, prior findings are fully healed  Chaperone, Terence Lux, CMA, was present for exam.  Assessment: Perirectal lesions that are completely healed Small right Bartholin's cyst Bee sting on neck  Plan: HSV I/II IgG antibody testing obtained today Recommend she start zyrtec 10mg  daily.  She will get this over the counter Clobetasol  0.05% ointment bid to skin.   D/w pt oral steroids but as area has improved significantly since Sunday, I think ok to add antihistamine and topical steroids.  She knows to be seen if does not continue to improve or worsens

## 2020-07-31 ENCOUNTER — Ambulatory Visit: Payer: 59 | Admitting: Obstetrics & Gynecology

## 2020-07-31 ENCOUNTER — Other Ambulatory Visit: Payer: Self-pay

## 2020-07-31 ENCOUNTER — Encounter: Payer: Self-pay | Admitting: Obstetrics & Gynecology

## 2020-07-31 VITALS — BP 110/72 | HR 70 | Resp 16 | Wt 157.0 lb

## 2020-07-31 DIAGNOSIS — N766 Ulceration of vulva: Secondary | ICD-10-CM

## 2020-07-31 DIAGNOSIS — N750 Cyst of Bartholin's gland: Secondary | ICD-10-CM | POA: Diagnosis not present

## 2020-07-31 DIAGNOSIS — T63441A Toxic effect of venom of bees, accidental (unintentional), initial encounter: Secondary | ICD-10-CM

## 2020-07-31 MED ORDER — CLOBETASOL PROPIONATE 0.05 % EX OINT
1.0000 | TOPICAL_OINTMENT | Freq: Two times a day (BID) | CUTANEOUS | 0 refills | Status: DC
Start: 2020-07-31 — End: 2020-09-20

## 2020-08-01 LAB — HSV(HERPES SIMPLEX VRS) I + II AB-IGG
HSV 1 Glycoprotein G Ab, IgG: <0.91 index (ref 0.00–0.90)
HSV 2 IgG, Type Spec: <0.91 index (ref 0.00–0.90)

## 2020-08-02 DIAGNOSIS — Z01818 Encounter for other preprocedural examination: Secondary | ICD-10-CM | POA: Diagnosis not present

## 2020-08-06 ENCOUNTER — Other Ambulatory Visit: Payer: Self-pay | Admitting: General Surgery

## 2020-08-06 DIAGNOSIS — D171 Benign lipomatous neoplasm of skin and subcutaneous tissue of trunk: Secondary | ICD-10-CM | POA: Diagnosis not present

## 2020-08-06 DIAGNOSIS — D3617 Benign neoplasm of peripheral nerves and autonomic nervous system of trunk, unspecified: Secondary | ICD-10-CM | POA: Diagnosis not present

## 2020-08-20 ENCOUNTER — Telehealth: Payer: Self-pay | Admitting: Medical

## 2020-08-20 MED ORDER — CYANOCOBALAMIN 1000 MCG/ML IJ SOLN: INTRAMUSCULAR | 0 refills | Status: DC

## 2020-08-20 NOTE — Telephone Encounter (Signed)
Rx sent 

## 2020-08-20 NOTE — Telephone Encounter (Signed)
NEW PHARMACY  Medication: cyanocobalamin (,VITAMIN B-12,) 1000 MCG/ML injection [[id number]]       Has the patient contacted their pharmacy?  (If no, request that the patient contact the pharmacy for the refill.) (If yes, when and what did the pharmacy advise?)     Preferred Pharmacy (with phone number or street name):  [facility] Address: [address] [place] [address] [phone]    Agent: Please be advised that RX refills may take up to 3 business days. We ask that you follow-up with your pharmacy.

## 2020-08-27 ENCOUNTER — Other Ambulatory Visit: Payer: 59

## 2020-08-27 NOTE — Addendum Note (Signed)
Addended by: Kelle Darting A on: 08/27/2020 08:09 AM   Modules accepted: Orders

## 2020-08-28 ENCOUNTER — Other Ambulatory Visit (INDEPENDENT_AMBULATORY_CARE_PROVIDER_SITE_OTHER): Payer: 59

## 2020-08-28 ENCOUNTER — Other Ambulatory Visit: Payer: Self-pay

## 2020-08-28 DIAGNOSIS — E538 Deficiency of other specified B group vitamins: Secondary | ICD-10-CM | POA: Diagnosis not present

## 2020-08-28 LAB — VITAMIN B12: Vitamin B-12: 414 pg/mL (ref 200–1100)

## 2020-08-29 ENCOUNTER — Telehealth: Payer: Self-pay | Admitting: Medical

## 2020-08-29 DIAGNOSIS — E538 Deficiency of other specified B group vitamins: Secondary | ICD-10-CM

## 2020-08-29 NOTE — Telephone Encounter (Signed)
Future b12 level placed.

## 2020-09-07 ENCOUNTER — Ambulatory Visit: Payer: 59 | Admitting: Neurology

## 2020-09-20 ENCOUNTER — Other Ambulatory Visit: Payer: Self-pay

## 2020-09-20 ENCOUNTER — Ambulatory Visit: Payer: 59 | Admitting: Obstetrics & Gynecology

## 2020-09-20 ENCOUNTER — Encounter: Payer: Self-pay | Admitting: Obstetrics & Gynecology

## 2020-09-20 VITALS — BP 120/80 | HR 68 | Resp 16 | Wt 161.0 lb

## 2020-09-20 DIAGNOSIS — F4321 Adjustment disorder with depressed mood: Secondary | ICD-10-CM | POA: Diagnosis not present

## 2020-09-20 DIAGNOSIS — N898 Other specified noninflammatory disorders of vagina: Secondary | ICD-10-CM | POA: Diagnosis not present

## 2020-09-20 MED ORDER — FLUCONAZOLE 150 MG PO TABS: ORAL_TABLET | ORAL | 0 refills | Status: DC

## 2020-09-20 NOTE — Progress Notes (Signed)
GYNECOLOGY  VISIT  CC:   Possible vaginitis  HPI: 39 y.o. G0P0000 Single White female here for vaginal itching & discharge.  She did use a 3 day monistat and this seemed to help.  She is having some whitish discharge and does feel itching.  She also thinks it looks a little red as well.  Pt reports very good friend, who was an off and on boyfriend, committed suicide a few weeks ago.  He had depression and an auto-immune d/o that caused a lot of physical pain.  She thinks he just got to a place where he thought nothing was ever going to get better.  They had not dated in several years but were good friends.  She's sad and tearful about this.  Feels supported.  Does not feel needs any additional resources at this time but asked her to reach out if she feels this changes.  GYNECOLOGIC HISTORY: Patient's last menstrual period was 09/03/2020 (exact date). Contraception: most of the time Menopausal hormone therapy: none  Patient Active Problem List   Diagnosis Date Noted  . Bartholin's cyst 07/31/2020  . Left hip pain 08/04/2016  . 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 GERD (gastroesophageal reflux disease) 07/23/2015  . Allergic rhinitis 07/23/2015    Past Medical History:  Diagnosis Date  . Allergy   . GERD (gastroesophageal reflux disease)     Past Surgical History:  Procedure Laterality Date  . LIPOMA EXCISION N/A 04/25/2016   Procedure: EXCISION LIPOMA;  Surgeon: Ralene Ok, MD;  Location: [facility];  Service: General;  Laterality: N/A;  . WISDOM TOOTH EXTRACTION      MEDS:   Current Outpatient Medications on File Prior to Visit  Medication Sig Dispense Refill  . Cyanocobalamin (B-12 PO) Take by mouth.    . NON FORMULARY [place] - prn    . omeprazole (PRILOSEC) 20 MG capsule Take 20 mg by mouth daily.    Marland Kitchen PAU D ARCO PO Take by mouth as needed.      No current facility-administered medications on file prior to visit.    ALLERGIES: Patient has no known allergies.  Family History   Problem Relation Age of Onset  . Hyperlipidemia Mother   . Lung cancer Maternal Grandfather   . Breast cancer Paternal Grandmother        PMP, diagnosis in her 43s?    SH:  Single, occasional cigarette smoking  Review of Systems  Constitutional: Negative.   HENT: Negative.   Eyes: Negative.   Respiratory: Negative.   Cardiovascular: Negative.   Gastrointestinal: Negative.   Endocrine: Negative.   Genitourinary:       Vaginal itching & discharge  Musculoskeletal: Negative.   Skin: Negative.   Allergic/Immunologic: Negative.   Neurological: Negative.   Hematological: Negative.   Psychiatric/Behavioral: Negative.     PHYSICAL EXAMINATION:    BP 120/80   Pulse 68   Resp 16   Wt 161 lb (73 kg)   LMP 09/03/2020 (Exact Date)   BMI 27.64 kg/m     General appearance: alert, cooperative and appears stated age Lymph:  no inguinal LAD noted  Pelvic: External genitalia:  no lesions, inner labia erythema with scant discharge              Urethra:  normal appearing urethra with no masses, tenderness or lesions              Bartholins and Skenes: normal  Vagina: normal appearing vagina with normal color and discharge, no lesions              Cervix: no lesions              Bimanual Exam:  Uterus:  normal size, contour, position, consistency, mobility, non-tender              Adnexa: no mass, fullness, tenderness  Chaperone, Terence Lux, CMA, was present for exam.  Assessment: Vaginal and vulvar itching with scant discharge Suicide of good friend, grief reaction  Plan: Affirm obtained Rx for diflucan 150mg  po x 1, repeat 72 hours to pharmacy Pt asked to call if feels she needs additional resources after death of friend.   22 minutes of total time was spent for this patient encounter, including preparation, face-to-face counseling with the patient and coordination of care, and documentation of the encounter.

## 2020-09-21 LAB — VAGINITIS/VAGINOSIS, DNA PROBE
Candida Species: NEGATIVE
Gardnerella vaginalis: NEGATIVE
Trichomonas vaginosis: NEGATIVE

## 2020-10-11 ENCOUNTER — Telehealth: Payer: Self-pay | Admitting: Medical

## 2020-10-11 NOTE — Telephone Encounter (Signed)
Patient scheduled with Dr.Lowne 10/15 , due to Edward's schedule being full

## 2020-10-11 NOTE — Telephone Encounter (Signed)
Patient is experiencing some chest pain, under left breast. Patient states she is not sure if is actually chest pain or acid reflux she is takes Prilosec every day. She is wondering if you could maybe order some blood work to make sure everything is ok. Patient prefers not to go to urgent care or hospital.

## 2020-10-12 ENCOUNTER — Ambulatory Visit: Payer: 59 | Admitting: Family Medicine

## 2020-10-15 ENCOUNTER — Ambulatory Visit: Payer: 59 | Admitting: Family Medicine

## 2020-10-19 ENCOUNTER — Ambulatory Visit: Payer: 59 | Admitting: Neurology

## 2020-10-19 ENCOUNTER — Ambulatory Visit: Payer: 59 | Admitting: Family Medicine

## 2020-10-19 ENCOUNTER — Ambulatory Visit: Payer: 59 | Admitting: Medical

## 2020-12-10 ENCOUNTER — Ambulatory Visit: Payer: 59 | Admitting: Neurology

## 2020-12-13 ENCOUNTER — Ambulatory Visit: Payer: 59 | Admitting: Obstetrics & Gynecology

## 2020-12-25 ENCOUNTER — Other Ambulatory Visit: Payer: Self-pay | Admitting: Obstetrics & Gynecology

## 2020-12-25 MED ORDER — METRONIDAZOLE 500 MG PO TABS: 500.0000 mg | ORAL_TABLET | Freq: Two times a day (BID) | ORAL | 0 refills | Status: DC

## 2021-01-23 ENCOUNTER — Telehealth (HOSPITAL_BASED_OUTPATIENT_CLINIC_OR_DEPARTMENT_OTHER): Payer: Self-pay | Admitting: Obstetrics & Gynecology

## 2021-01-23 ENCOUNTER — Other Ambulatory Visit: Payer: Self-pay

## 2021-01-23 ENCOUNTER — Encounter: Payer: Self-pay | Admitting: Obstetrics & Gynecology

## 2021-01-23 ENCOUNTER — Ambulatory Visit (INDEPENDENT_AMBULATORY_CARE_PROVIDER_SITE_OTHER): Payer: 59 | Admitting: Obstetrics & Gynecology

## 2021-01-23 VITALS — BP 129/85 | HR 91 | Ht 65.0 in | Wt 169.0 lb

## 2021-01-23 DIAGNOSIS — Z708 Other sex counseling: Secondary | ICD-10-CM

## 2021-01-23 DIAGNOSIS — Z23 Encounter for immunization: Secondary | ICD-10-CM

## 2021-01-23 DIAGNOSIS — N750 Cyst of Bartholin's gland: Secondary | ICD-10-CM | POA: Diagnosis not present

## 2021-01-23 DIAGNOSIS — Z20828 Contact with and (suspected) exposure to other viral communicable diseases: Secondary | ICD-10-CM

## 2021-01-23 NOTE — Progress Notes (Signed)
GYNECOLOGY  VISIT  CC:   Vaginal symptoms  HPI: 40 y.o. G0P0000 Single Caucasian female here for vulvar check and to discuss some concerns.  Current boyfriend has hx of HSV1 that is an outbreak on his legs.  He had this tested and it was HSV1.  He was given valtrex for intermittent use but now has started suppressive therapy.  she wants to know if there are other ways to decrease transmission.  As it is on his middle inner thigh per her description, keeping it covered will also decrease risk.  Reminded pt HSV is transmitted through viral shedding.    He also went for treatment for a probable genital wart after they started seeing each other.  She thought he had a wart and he went to his PCP.  It was frozen but no testing done.  Pt has not seen any new lesions.  Although she has already been exposed (and we discussed this), d/w pt Gardisil administration.  She's never received Gardisil and, after questions answered, agreed she would like to start.    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  Sister is in the [place] area and due with her first baby at the end of March.  All of these reasons made sense for her to do traveling nursing, at least this winter/spring.  Lastly, she has a vulvar bump she wants me to look at.  Pt has know bartholin's cyst.  Reports it is not hard and non tender.  Patient's last menstrual period was 01/16/2021.  Patient Active Problem List   Diagnosis Date Noted  . Bartholin cyst 07/31/2020  . 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 GERD (gastroesophageal reflux disease) 07/23/2015  . Allergic rhinitis 07/23/2015    Past Medical History:  Diagnosis Date  . Allergy   . GERD (gastroesophageal reflux disease)     Past Surgical History:  Procedure Laterality Date  . LIPOMA EXCISION N/A 04/25/2016   Procedure:  EXCISION LIPOMA;  Surgeon: Ralene Ok, MD;  Location: [facility];  Service: General;  Laterality: N/A;  . WISDOM TOOTH EXTRACTION      MEDS:   Current Outpatient Medications on File Prior to Visit  Medication Sig Dispense Refill  . Cyanocobalamin (B-12 PO) Take by mouth.    Marland Kitchen omeprazole (PRILOSEC) 20 MG capsule Take 20 mg by mouth daily.     No current facility-administered medications on file prior to visit.    ALLERGIES: Patient has no known allergies.  Family History  Problem Relation Age of Onset  . Hyperlipidemia Mother   . Lung cancer Maternal Grandfather   . Breast cancer Paternal Grandmother        PMP, diagnosis in her 79s?    SH:  Single, social smoker  Review of Systems  Constitutional: Negative.   Gastrointestinal: Negative.   Genitourinary: Negative.   Skin: Negative.   Psychiatric/Behavioral: Negative.     PHYSICAL EXAMINATION:    BP 129/85   Pulse 91   Ht 5\' 5"  (1.651 m)   Wt 169 lb (76.7 kg)   LMP 01/16/2021   BMI 28.12 kg/m     General appearance: alert, cooperative and appears stated age Lymph:  no inguinal LAD noted  Pelvic: External genitalia:  Right, 2.5cm non-tender Bartholin's cyst, stable  Urethra:  normal appearing urethra with no masses, tenderness or lesions              Bartholins and Skenes: normal                 Vagina: normal appearing vagina with normal color and discharge, no lesions              Cervix: no lesions  Chaperone, Sherryle Lis, RN, was present for exam.  Assessment/Plan:  1. Counseling regarding human papilloma virus (HPV) - HPV 9-valent vaccine,Recombinat  2. Bartholin cyst - Pt reassured.  This is stable and excision, I&D not indicated.  Advised to just keep monitoring  3. Possible exposure to herpes simplex virus (HSV) - risk reducing strategies discussed.  Significant other will stay on suppressive therapy  22 minutes of total time was spent for this patient encounter, including preparation,  face-to-face counseling with the patient and coordination of care, and documentation of the encounter.

## 2021-01-23 NOTE — Telephone Encounter (Signed)
Called patient to schedule for her second and third Gardasil patient stated that she may be having it done somewhere else.

## 2021-02-23 DIAGNOSIS — E538 Deficiency of other specified B group vitamins: Secondary | ICD-10-CM | POA: Insufficient documentation

## 2021-02-23 DIAGNOSIS — Z720 Tobacco use: Secondary | ICD-10-CM | POA: Insufficient documentation

## 2021-03-08 ENCOUNTER — Ambulatory Visit: Payer: 59 | Admitting: Neurology

## 2021-03-26 ENCOUNTER — Other Ambulatory Visit (HOSPITAL_COMMUNITY)
Admission: RE | Admit: 2021-03-26 | Discharge: 2021-03-26 | Disposition: A | Discharge status: Home / Self Care | Payer: 59 | Source: Ambulatory Visit | Attending: Obstetrics & Gynecology | Admitting: Obstetrics & Gynecology

## 2021-03-26 ENCOUNTER — Ambulatory Visit (INDEPENDENT_AMBULATORY_CARE_PROVIDER_SITE_OTHER): Payer: 59 | Admitting: Obstetrics & Gynecology

## 2021-03-26 ENCOUNTER — Encounter (HOSPITAL_BASED_OUTPATIENT_CLINIC_OR_DEPARTMENT_OTHER): Payer: Self-pay | Admitting: Obstetrics & Gynecology

## 2021-03-26 VITALS — BP 125/79 | HR 88 | Wt 172.0 lb

## 2021-03-26 DIAGNOSIS — N898 Other specified noninflammatory disorders of vagina: Secondary | ICD-10-CM | POA: Insufficient documentation

## 2021-03-26 DIAGNOSIS — N750 Cyst of Bartholin's gland: Secondary | ICD-10-CM | POA: Diagnosis not present

## 2021-03-26 DIAGNOSIS — Z23 Encounter for immunization: Secondary | ICD-10-CM | POA: Diagnosis not present

## 2021-03-26 MED ORDER — METRONIDAZOLE 500 MG PO TABS: 500.0000 mg | ORAL_TABLET | Freq: Two times a day (BID) | ORAL | 0 refills | Status: AC

## 2021-03-26 NOTE — Progress Notes (Signed)
GYNECOLOGY  VISIT  CC:   Vaginal odor  HPI: 40 y.o. G0P0000 Single Unavailable female here for complaint of vaginal odor that has been present intermittently for about a month.  She used monistat x 1, OTC.  This helped some but it did not go away completely.  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  This was two weeks ago.  Odor has continued.  No STD concerns.  Pt reports she also had GC/Chl/trich testing two weeks ago.  Denies bladder symptoms, pelvic pain or fever.  She is having irritation as well as some skin redness.   Going back to [place] tomorrow.  Will be driving.  GYNECOLOGIC HISTORY: No LMP recorded. Contraception: condoms  Patient Active Problem List   Diagnosis Date Noted  . Bartholin cyst 07/31/2020  . 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 GERD (gastroesophageal reflux disease) 07/23/2015  . Allergic rhinitis 07/23/2015    Past Medical History:  Diagnosis Date  . Allergy   . GERD (gastroesophageal reflux disease)     Past Surgical History:  Procedure Laterality Date  . LIPOMA EXCISION N/A 04/25/2016   Procedure: EXCISION LIPOMA;  Surgeon: Ralene Ok, MD;  Location: [facility];  Service: General;  Laterality: N/A;  . WISDOM TOOTH EXTRACTION      MEDS:   Current Outpatient Medications on File Prior to Visit  Medication Sig Dispense Refill  . Cyanocobalamin (B-12 PO) Take by mouth.    Marland Kitchen omeprazole (PRILOSEC) 20 MG capsule Take 20 mg by mouth daily.     No current facility-administered medications on file prior to visit.    ALLERGIES: Patient has no known allergies.  Family History  Problem Relation Age of Onset  . Hyperlipidemia Mother   . Lung cancer Maternal Grandfather   . Breast cancer Paternal Grandmother        PMP, diagnosis in her 95s?    SH:  Single, non smoker  Review of Systems  Constitutional: Negative.   Gastrointestinal: Negative.   Genitourinary: Negative.     PHYSICAL EXAMINATION:    BP 125/79   Pulse 88    Wt 172 lb (78 kg)   BMI 28.62 kg/m     General appearance: alert, cooperative and appears stated age Lymph:  no inguinal LAD noted  Pelvic: External genitalia:  Right 2cm bartholin's gland cyst, non tender              Urethra:  normal appearing urethra with no masses, tenderness or lesions              Bartholins and Skenes: normal                 Vagina: normal appearing vagina with whitish discharge present, no lesions              Cervix: no lesions              Bimanual Exam:  Uterus:  normal size, contour, position, consistency, mobility, non-tender              Adnexa: no mass, fullness, tenderness  Chaperone, Prince Rome, CMA, was present for exam.  Assessment/Plan: 1. Vaginal odor - Cervicovaginal ancillary only( [facility]) - metroNIDAZOLE (FLAGYL) 500 MG tablet; Take 1 tablet (500 mg total) by mouth 2 (two) times daily for 7 days.  Dispense: 14 tablet; Refill: 0  2. Need for HPV vaccination - HPV 9-valent vaccine,Recombinat  3.  Bartholin's cyst

## 2021-03-27 LAB — CERVICOVAGINAL ANCILLARY ONLY
Bacterial Vaginitis (gardnerella): NEGATIVE
Candida Glabrata: NEGATIVE
Candida Vaginitis: NEGATIVE
Comment: NEGATIVE
Comment: NEGATIVE
Comment: NEGATIVE

## 2021-07-26 ENCOUNTER — Ambulatory Visit (HOSPITAL_BASED_OUTPATIENT_CLINIC_OR_DEPARTMENT_OTHER): Payer: 59 | Admitting: Obstetrics & Gynecology

## 2021-07-26 ENCOUNTER — Encounter (HOSPITAL_BASED_OUTPATIENT_CLINIC_OR_DEPARTMENT_OTHER): Payer: Self-pay

## 2021-11-02 IMAGING — DX DG CERVICAL SPINE COMPLETE 4+V
6 series · 6 of 6 positions shown · non-contrast
Comparison: None.

CLINICAL DATA: Neck pain with tingling down both arms for 1 week.

EXAM:
CERVICAL SPINE - COMPLETE 4+ VIEW

[c-spine lat]
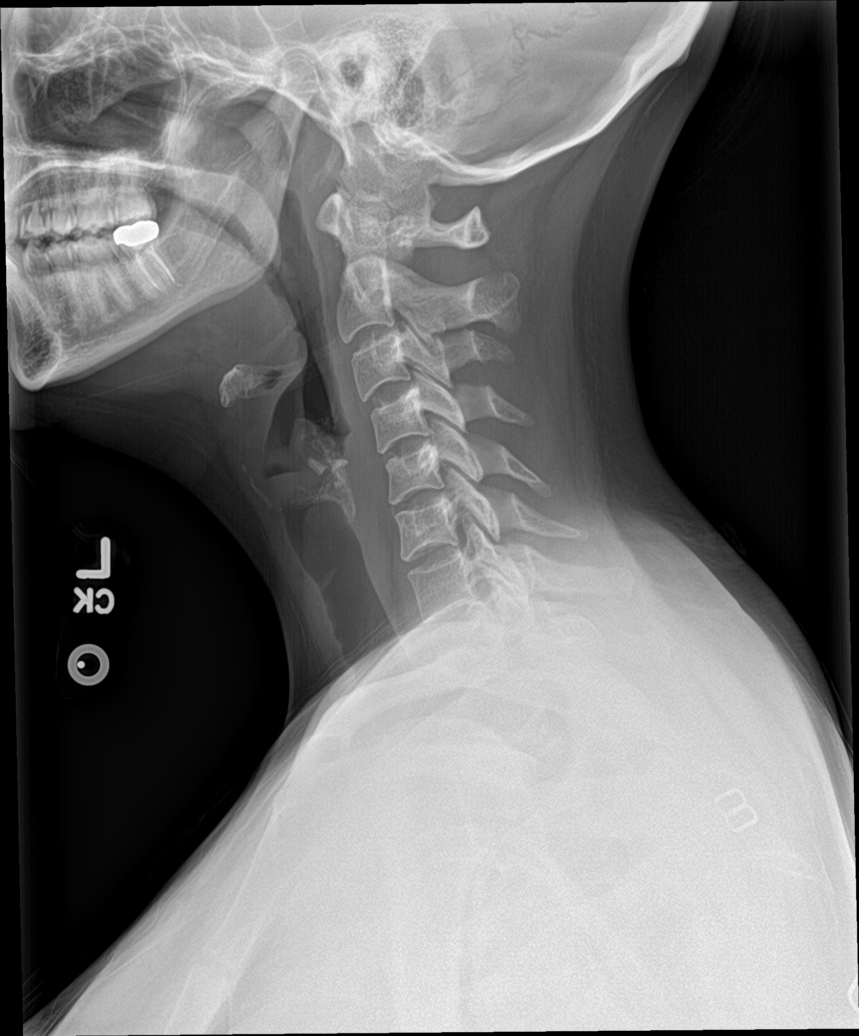

[c-spine obl (1 of 2)]
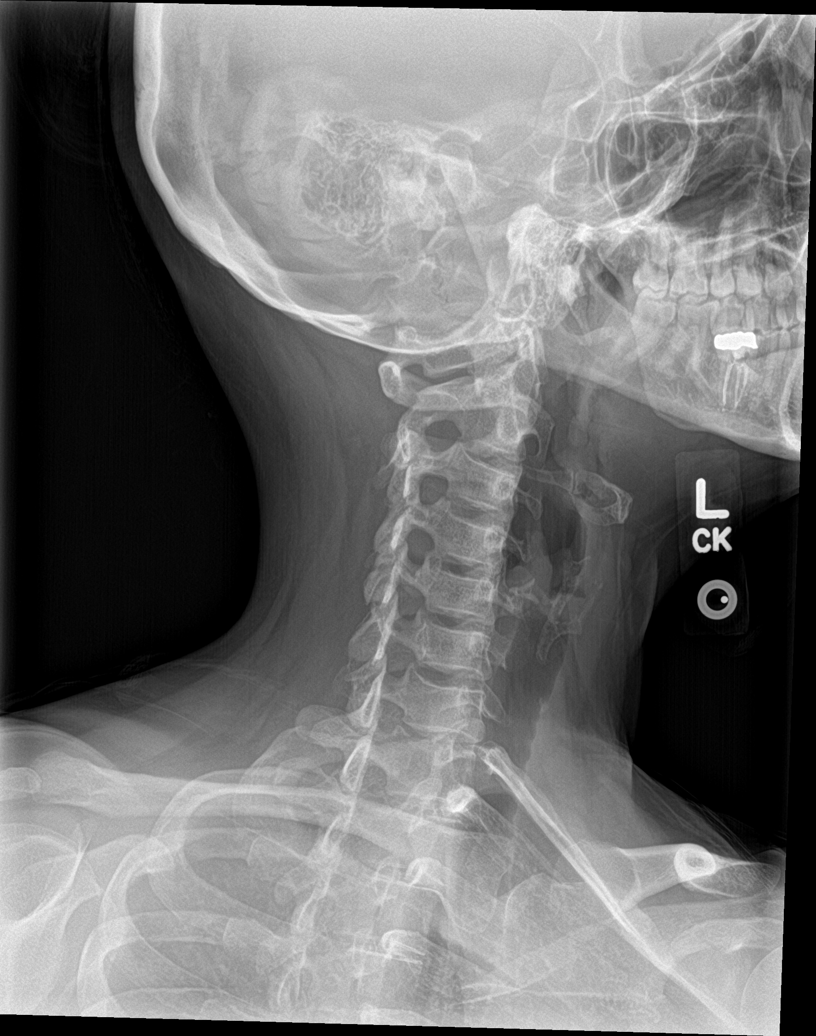

[c-spine obl (2 of 2)]
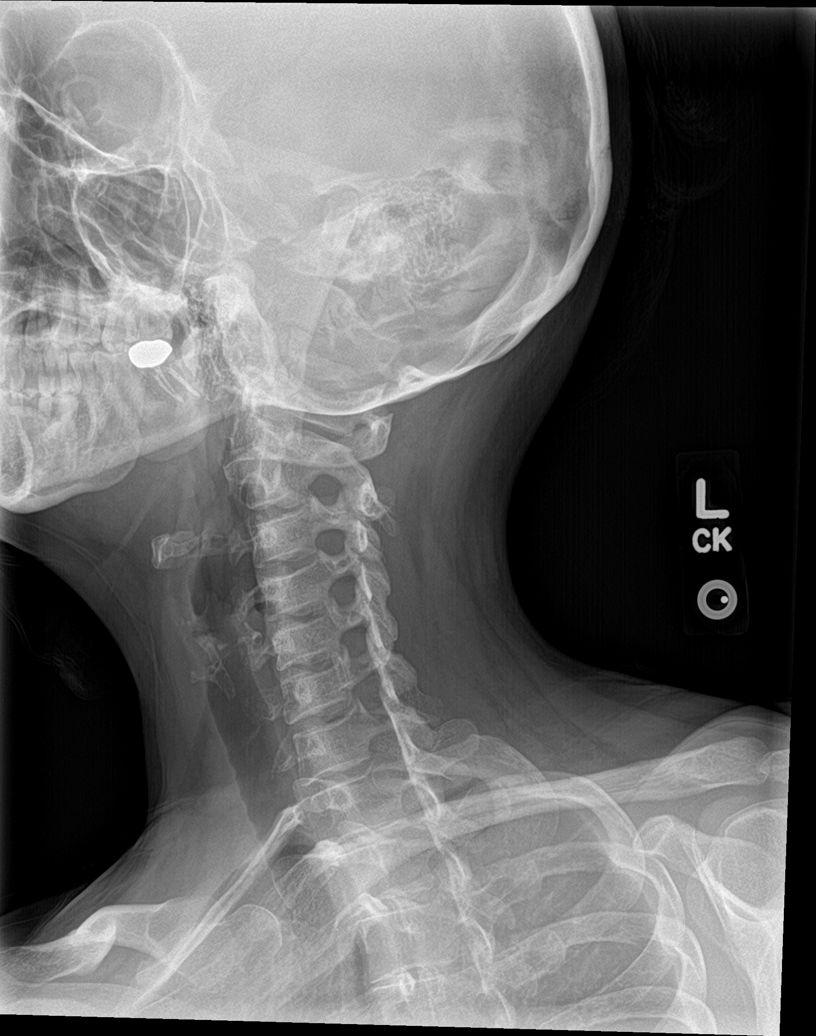

[c-spine ap]
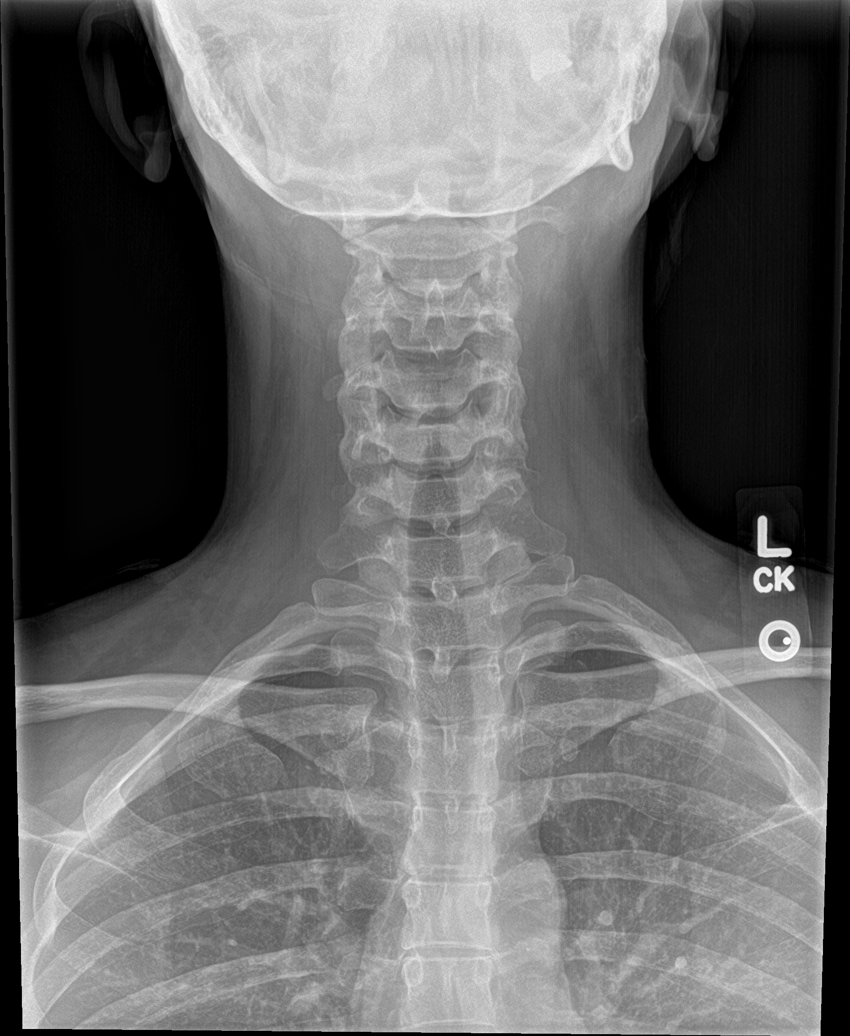

[c-spine open mouth]
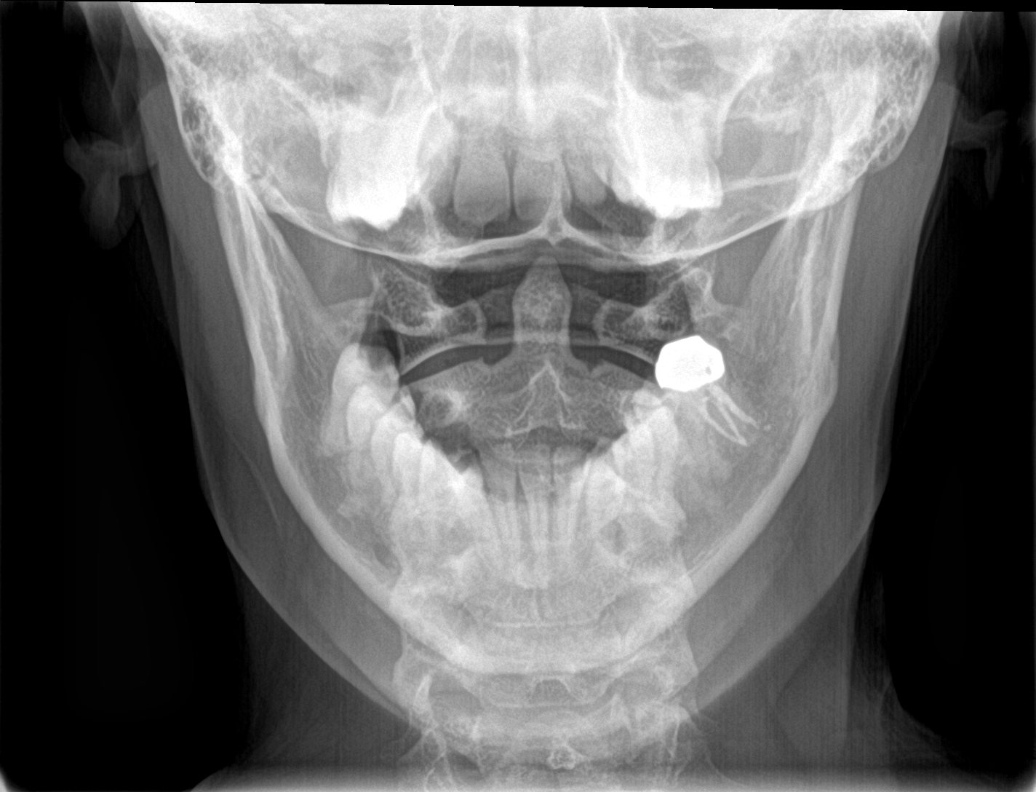

[c-spine swimmers]
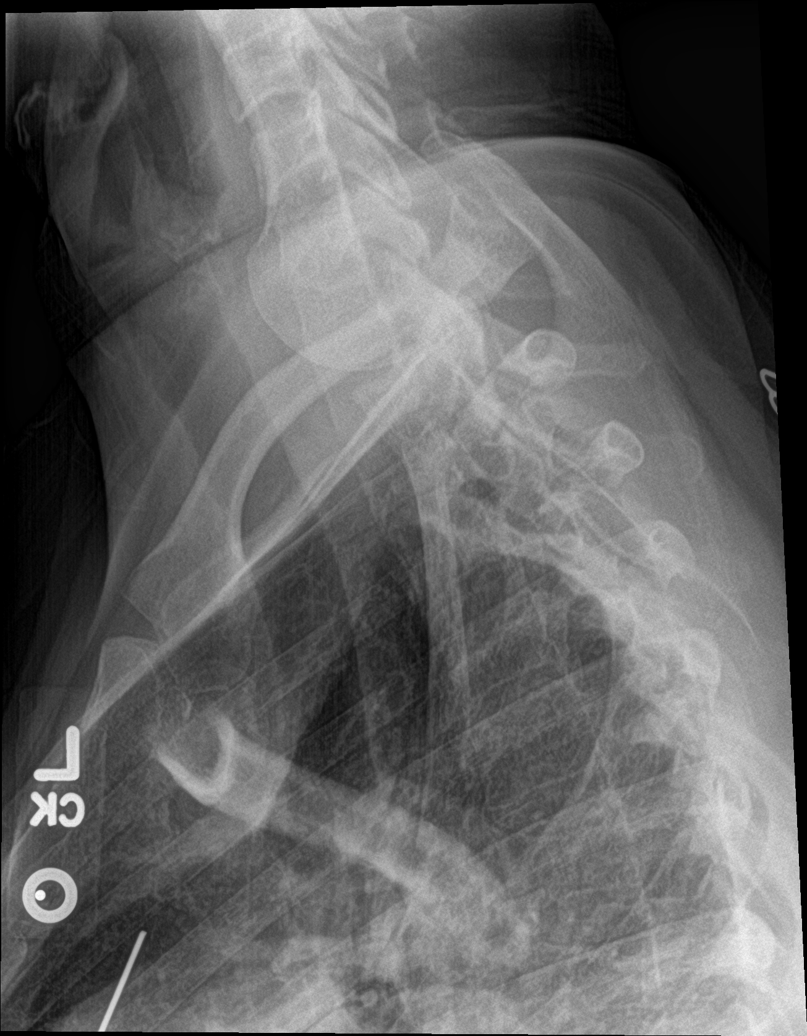

[6 of 6 positions shown; findings below may reference images not displayed]

FINDINGS: There is no evidence of cervical spine fracture or prevertebral soft
tissue swelling. Alignment is normal. No other significant bone
abnormalities are identified.
IMPRESSION: Negative cervical spine radiographs.

## 2021-11-07 ENCOUNTER — Encounter (HOSPITAL_BASED_OUTPATIENT_CLINIC_OR_DEPARTMENT_OTHER): Payer: Self-pay

## 2021-11-08 ENCOUNTER — Other Ambulatory Visit (HOSPITAL_BASED_OUTPATIENT_CLINIC_OR_DEPARTMENT_OTHER): Payer: Self-pay | Admitting: Obstetrics & Gynecology

## 2021-11-08 DIAGNOSIS — B001 Herpesviral vesicular dermatitis: Secondary | ICD-10-CM

## 2021-11-08 MED ORDER — VALACYCLOVIR HCL 1 G PO TABS: ORAL_TABLET | ORAL | 1 refills | Status: DC

## 2021-11-14 ENCOUNTER — Encounter (HOSPITAL_BASED_OUTPATIENT_CLINIC_OR_DEPARTMENT_OTHER): Payer: Self-pay | Admitting: *Deleted

## 2021-11-27 ENCOUNTER — Ambulatory Visit (INDEPENDENT_AMBULATORY_CARE_PROVIDER_SITE_OTHER): Payer: Self-pay | Admitting: Obstetrics & Gynecology

## 2021-11-27 ENCOUNTER — Other Ambulatory Visit: Payer: Self-pay

## 2021-11-27 ENCOUNTER — Encounter (HOSPITAL_BASED_OUTPATIENT_CLINIC_OR_DEPARTMENT_OTHER): Payer: Self-pay | Admitting: Obstetrics & Gynecology

## 2021-11-27 VITALS — BP 135/75 | HR 85 | Ht 64.0 in | Wt 180.2 lb

## 2021-11-27 DIAGNOSIS — B001 Herpesviral vesicular dermatitis: Secondary | ICD-10-CM

## 2021-11-27 DIAGNOSIS — D251 Intramural leiomyoma of uterus: Secondary | ICD-10-CM

## 2021-11-27 DIAGNOSIS — Z23 Encounter for immunization: Secondary | ICD-10-CM

## 2021-11-27 DIAGNOSIS — Z720 Tobacco use: Secondary | ICD-10-CM

## 2021-11-27 DIAGNOSIS — N920 Excessive and frequent menstruation with regular cycle: Secondary | ICD-10-CM

## 2021-11-27 NOTE — Progress Notes (Signed)
GYNECOLOGY  VISIT  CC:   discuss cycle changes  HPI: 40 y.o. G0P0000 Single female here for several concerns/complaints.  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  Right now, does not have insurance but will as soon as she has a new assignment.  She and boyfriend did break up.  She is dealing well with this.    Reports bleeding has gotten worse since she's been in [place].  Flow is heavier and with more clots and lasts longer.  Pt does have hx of uterine fibroids.  Last ultrasound was 12/2017 so updating this is reasonable.  Does smoke so cannot be on estrogen containing methods.  Does not feel she needs any treatment at this time but may considering what changes are present with ultrasound.  She has hx of fever blisters and does use valtrex.  Was refilled earlier this month.  Pt does not need at this time.  Last pap 06/2020 neg with neg HR HPV.  She needs her final gardisil vaccination but we will wait on this until she has her next work assignment.    Patient Active Problem List   Diagnosis Date Noted   Tobacco use 02/23/2021   Vitamin B12 deficiency 02/23/2021   Bartholin cyst 07/31/2020   Lipoma 07/23/2015   GERD (gastroesophageal reflux disease) 07/23/2015   Allergic rhinitis 07/23/2015    Past Medical History:  Diagnosis Date   Allergy    GERD (gastroesophageal reflux disease)     Past Surgical History:  Procedure Laterality Date   LIPOMA EXCISION N/A 04/25/2016   Procedure: EXCISION LIPOMA;  Surgeon: Ralene Ok, MD;  Location: [facility];  Service: General;  Laterality: N/A;   WISDOM TOOTH EXTRACTION      MEDS:   Current Outpatient Medications on File Prior to Visit  Medication Sig Dispense Refill   Cyanocobalamin (B-12 PO) Take by mouth.     omeprazole (PRILOSEC) 20 MG capsule Take 20 mg by mouth daily.     valACYclovir (VALTREX) 1000 MG tablet Take 2 tabs (2 gram) and  repeat in 12 hours with symptom onset. 30 tablet 1   No current facility-administered medications on file prior to visit.    ALLERGIES: Patient has no known allergies.  Family History  Problem Relation Age of Onset   Hyperlipidemia Mother    Lung cancer Maternal Grandfather    Breast cancer Paternal Grandmother        PMP, diagnosis in her 38s?    SH:  single, non smoker  Review of Systems  Constitutional: Negative.   Genitourinary:        Menstrual cycle changes   PHYSICAL EXAMINATION:    BP 135/75 (BP Location: Left Arm, Patient Position: Sitting, Cuff Size: Large)   Pulse 85   Ht 5\' 4"  (1.626 m)   Wt 180 lb 3.2 oz (81.7 kg)   LMP 11/13/2021   BMI 30.93 kg/m     Physical Exam Vitals reviewed.  Constitutional:      Appearance: Normal appearance.  Neurological:     General: No focal deficit present.  Psychiatric:        Mood and Affect: Mood normal.    Assessment/Plan: 1. Intramural leiomyoma of uterus - US PELVIS TRANSVAGINAL NON-OB (TV ONLY); Future  2. Menorrhagia with regular cycle  3. Tobacco use  4. Need for HPV vaccination - will plan third one once pt has new  job assignment  5. Fever blister - Has RF for Valtrex 1 gram po x 2 tabs, repeat in 12 hours.  If needs new Rx, pt knows to call.

## 2021-11-30 DIAGNOSIS — N920 Excessive and frequent menstruation with regular cycle: Secondary | ICD-10-CM | POA: Insufficient documentation

## 2021-11-30 DIAGNOSIS — B001 Herpesviral vesicular dermatitis: Secondary | ICD-10-CM | POA: Insufficient documentation

## 2021-11-30 DIAGNOSIS — D251 Intramural leiomyoma of uterus: Secondary | ICD-10-CM | POA: Insufficient documentation

## 2022-01-23 ENCOUNTER — Encounter (HOSPITAL_BASED_OUTPATIENT_CLINIC_OR_DEPARTMENT_OTHER): Payer: Self-pay | Admitting: Obstetrics & Gynecology

## 2022-04-02 ENCOUNTER — Other Ambulatory Visit (HOSPITAL_BASED_OUTPATIENT_CLINIC_OR_DEPARTMENT_OTHER): Payer: Self-pay

## 2022-04-02 ENCOUNTER — Ambulatory Visit (HOSPITAL_BASED_OUTPATIENT_CLINIC_OR_DEPARTMENT_OTHER): Payer: Self-pay | Admitting: Obstetrics & Gynecology

## 2022-04-02 ENCOUNTER — Other Ambulatory Visit (HOSPITAL_BASED_OUTPATIENT_CLINIC_OR_DEPARTMENT_OTHER): Payer: Self-pay | Admitting: *Deleted

## 2022-04-02 DIAGNOSIS — D259 Leiomyoma of uterus, unspecified: Secondary | ICD-10-CM

## 2022-05-14 ENCOUNTER — Ambulatory Visit (INDEPENDENT_AMBULATORY_CARE_PROVIDER_SITE_OTHER): Payer: 59

## 2022-05-14 ENCOUNTER — Ambulatory Visit (HOSPITAL_BASED_OUTPATIENT_CLINIC_OR_DEPARTMENT_OTHER): Payer: 59 | Admitting: Obstetrics & Gynecology

## 2022-05-14 ENCOUNTER — Other Ambulatory Visit (HOSPITAL_COMMUNITY)
Admission: RE | Admit: 2022-05-14 | Discharge: 2022-05-14 | Disposition: A | Discharge status: Home / Self Care | Payer: 59 | Source: Ambulatory Visit | Attending: Obstetrics & Gynecology | Admitting: Obstetrics & Gynecology

## 2022-05-14 ENCOUNTER — Encounter (HOSPITAL_BASED_OUTPATIENT_CLINIC_OR_DEPARTMENT_OTHER): Payer: Self-pay | Admitting: Obstetrics & Gynecology

## 2022-05-14 VITALS — BP 131/77 | HR 77 | Ht 64.0 in | Wt 177.4 lb

## 2022-05-14 DIAGNOSIS — Z23 Encounter for immunization: Secondary | ICD-10-CM | POA: Diagnosis not present

## 2022-05-14 DIAGNOSIS — D259 Leiomyoma of uterus, unspecified: Secondary | ICD-10-CM

## 2022-05-14 DIAGNOSIS — D251 Intramural leiomyoma of uterus: Secondary | ICD-10-CM | POA: Diagnosis not present

## 2022-05-14 DIAGNOSIS — N898 Other specified noninflammatory disorders of vagina: Secondary | ICD-10-CM | POA: Insufficient documentation

## 2022-05-14 DIAGNOSIS — Z113 Encounter for screening for infections with a predominantly sexual mode of transmission: Secondary | ICD-10-CM | POA: Diagnosis not present

## 2022-05-15 ENCOUNTER — Encounter (HOSPITAL_BASED_OUTPATIENT_CLINIC_OR_DEPARTMENT_OTHER): Payer: Self-pay | Admitting: Obstetrics & Gynecology

## 2022-05-15 ENCOUNTER — Other Ambulatory Visit (HOSPITAL_BASED_OUTPATIENT_CLINIC_OR_DEPARTMENT_OTHER): Payer: Self-pay | Admitting: Obstetrics & Gynecology

## 2022-05-15 LAB — CERVICOVAGINAL ANCILLARY ONLY
Bacterial Vaginitis (gardnerella): POSITIVE — AB
Candida Glabrata: NEGATIVE
Candida Vaginitis: NEGATIVE
Chlamydia: NEGATIVE
Comment: NEGATIVE
Comment: NEGATIVE
Comment: NEGATIVE
Comment: NEGATIVE
Comment: NEGATIVE
Comment: NORMAL
Neisseria Gonorrhea: NEGATIVE
Trichomonas: NEGATIVE

## 2022-05-15 MED ORDER — METRONIDAZOLE 500 MG PO TABS: 500.0000 mg | ORAL_TABLET | Freq: Two times a day (BID) | ORAL | 0 refills | Status: DC

## 2022-05-17 NOTE — Progress Notes (Signed)
GYNECOLOGY  VISIT  CC:   follow up after ultrasound  HPI: 41 y.o. G0P0000 Single White female here for discussion of ultrasound.  Pt has known fibroid.  Uterus today is enlarged from prior ultrasound measuring 12 x 13.5 x 11cm with single 8.1cm fibroid.  Endometrium is difficult to image due to fibroid.  Pt does have heavy bleeding with cycle but finds this manageable.  We did review prior ultrasound and fibroid has doubled in size from diameter standpoint.  Pt does smoke socially so there are some medications that cannot be used with her at this time.  Progesterones, Depo Lupron and surgical options reviewed.  So not feel she is a good candidate for sonata procedure.  She does wish to maintain fertility and does not want a procedure that would prevent future pregnancies.  Questions answered.  Pt does need final gardisil administered today.  She is reporting increased vaginal odor.  Is not in long term relationship.  Recommended vaginitis testing and will obtain with self swab today.   Patient Active Problem List   Diagnosis Date Noted   Fever blister 11/30/2021   Menorrhagia with regular cycle 11/30/2021   Intramural leiomyoma of uterus 11/30/2021   Tobacco use 02/23/2021   Vitamin B12 deficiency 02/23/2021   Bartholin cyst 07/31/2020   Lipoma 07/23/2015   GERD (gastroesophageal reflux disease) 07/23/2015   Allergic rhinitis 07/23/2015    Past Medical History:  Diagnosis Date   Allergy    GERD (gastroesophageal reflux disease)     Past Surgical History:  Procedure Laterality Date   LIPOMA EXCISION N/A 04/25/2016   Procedure: EXCISION LIPOMA;  Surgeon: Ralene Ok, MD;  Location: [facility];  Service: General;  Laterality: N/A;   WISDOM TOOTH EXTRACTION      MEDS:   Current Outpatient Medications on File Prior to Visit  Medication Sig Dispense Refill   Acidophilus Lactobacillus CAPS Take 1 tablet by mouth daily.     Ascorbic Acid (VITAMIN C) 100 MG CHEW Chew 0.5 tablets by mouth  daily.     Cyanocobalamin (B-12 PO) Take by mouth.     L-Lysine 500 MG CAPS Take 500 mg by mouth daily.     LACTOBACILLUS RHAMNOSUS, GG, PO Take 1 tablet by mouth daily.     omeprazole (PRILOSEC) 20 MG capsule Take 20 mg by mouth daily.     valACYclovir (VALTREX) 1000 MG tablet Take 2 tabs (2 gram) and repeat in 12 hours with symptom onset. 30 tablet 1   No current facility-administered medications on file prior to visit.    ALLERGIES: Patient has no known allergies.  Family History  Problem Relation Age of Onset   Hyperlipidemia Mother    Lung cancer Maternal Grandfather    Breast cancer Paternal Grandmother        PMP, diagnosis in her 79s?    SH:  single, social smoker  Review of Systems  Genitourinary:        Menorrhagia   PHYSICAL EXAMINATION:    BP 131/77 (BP Location: Left Arm, Patient Position: Sitting, Cuff Size: Large)   Pulse 77   Ht '5\' 4"'$  (1.626 m)   Wt 177 lb 6.4 oz (80.5 kg)   BMI 30.45 kg/m     Physical Exam Constitutional:      Appearance: Normal appearance.  Neurological:     General: No focal deficit present.     Mental Status: She is alert and oriented to person, place, and time.  Psychiatric:  Mood and Affect: Mood normal.     Assessment/Plan: 1. Intramural leiomyoma of uterus - treatment options reviewed including myomectomy, [place], RFA treatment of fibroid, medical therapy including progesterones, Depo Lupron.  Pt is a social smoker so cannot use estrogens or Myfembree.  She is going to consider options and let me know if desires any referrals at any point  2. Vaginal odor - Cervicovaginal ancillary only( [facility])  3. Screen for STD (sexually transmitted disease) - Cervicovaginal ancillary only( [facility])  4. Need for HPV vaccination - HPV 9-valent vaccine,Recombinat

## 2022-07-04 ENCOUNTER — Encounter (HOSPITAL_BASED_OUTPATIENT_CLINIC_OR_DEPARTMENT_OTHER): Payer: Self-pay | Admitting: *Deleted

## 2022-07-08 ENCOUNTER — Other Ambulatory Visit (HOSPITAL_BASED_OUTPATIENT_CLINIC_OR_DEPARTMENT_OTHER): Payer: Self-pay | Admitting: *Deleted

## 2022-07-08 DIAGNOSIS — B001 Herpesviral vesicular dermatitis: Secondary | ICD-10-CM

## 2022-07-08 MED ORDER — VALACYCLOVIR HCL 1 G PO TABS: ORAL_TABLET | ORAL | 1 refills | Status: DC

## 2022-07-10 ENCOUNTER — Other Ambulatory Visit (HOSPITAL_COMMUNITY)
Admission: RE | Admit: 2022-07-10 | Discharge: 2022-07-10 | Disposition: A | Discharge status: Home / Self Care | Payer: 59 | Source: Ambulatory Visit | Attending: Obstetrics & Gynecology | Admitting: Obstetrics & Gynecology

## 2022-07-10 ENCOUNTER — Ambulatory Visit (HOSPITAL_BASED_OUTPATIENT_CLINIC_OR_DEPARTMENT_OTHER): Payer: 59

## 2022-07-10 DIAGNOSIS — N898 Other specified noninflammatory disorders of vagina: Secondary | ICD-10-CM | POA: Diagnosis present

## 2022-07-10 NOTE — Progress Notes (Signed)
Patient came in today to do self swab aptima. Patient states she is having a little vaginal discomfort and odor. tbw

## 2022-07-11 LAB — CERVICOVAGINAL ANCILLARY ONLY
Bacterial Vaginitis (gardnerella): NEGATIVE
Candida Glabrata: NEGATIVE
Candida Vaginitis: NEGATIVE
Comment: NEGATIVE
Comment: NEGATIVE
Comment: NEGATIVE

## 2022-09-04 ENCOUNTER — Telehealth (HOSPITAL_BASED_OUTPATIENT_CLINIC_OR_DEPARTMENT_OTHER): Payer: Self-pay | Admitting: *Deleted

## 2022-09-04 NOTE — Telephone Encounter (Signed)
Returned pts call. She reports having persistent vaginal discomfort and odor. She came in last month for a self swab and it was negative. She denies burning with urination. Appt given for evaluation by provider.

## 2022-09-05 ENCOUNTER — Ambulatory Visit (HOSPITAL_BASED_OUTPATIENT_CLINIC_OR_DEPARTMENT_OTHER): Payer: 59 | Admitting: Obstetrics & Gynecology

## 2022-11-06 DIAGNOSIS — R0981 Nasal congestion: Secondary | ICD-10-CM | POA: Diagnosis not present

## 2022-11-06 DIAGNOSIS — R509 Fever, unspecified: Secondary | ICD-10-CM | POA: Diagnosis not present

## 2022-11-06 DIAGNOSIS — J09X2 Influenza due to identified novel influenza A virus with other respiratory manifestations: Secondary | ICD-10-CM | POA: Diagnosis not present

## 2022-11-06 DIAGNOSIS — Z681 Body mass index (BMI) 19 or less, adult: Secondary | ICD-10-CM | POA: Diagnosis not present

## 2023-02-20 ENCOUNTER — Telehealth: Payer: Self-pay | Admitting: Medical

## 2023-02-20 NOTE — Telephone Encounter (Signed)
Patient requests to transfer care from [facility] to Dr. Esther Hardy. Is this okay with you?

## 2023-02-27 ENCOUNTER — Ambulatory Visit: Payer: 59 | Admitting: Family Medicine

## 2023-03-17 ENCOUNTER — Encounter: Payer: Self-pay | Admitting: Family Medicine

## 2023-03-17 ENCOUNTER — Ambulatory Visit: Payer: Commercial Managed Care - PPO | Admitting: Family Medicine

## 2023-03-17 VITALS — BP 130/80 | HR 85 | Temp 98.3°F | Ht 65.0 in | Wt 167.5 lb

## 2023-03-17 DIAGNOSIS — N920 Excessive and frequent menstruation with regular cycle: Secondary | ICD-10-CM

## 2023-03-17 DIAGNOSIS — D251 Intramural leiomyoma of uterus: Secondary | ICD-10-CM

## 2023-03-17 DIAGNOSIS — K219 Gastro-esophageal reflux disease without esophagitis: Secondary | ICD-10-CM | POA: Diagnosis not present

## 2023-03-17 DIAGNOSIS — E538 Deficiency of other specified B group vitamins: Secondary | ICD-10-CM

## 2023-03-17 DIAGNOSIS — R2231 Localized swelling, mass and lump, right upper limb: Secondary | ICD-10-CM | POA: Diagnosis not present

## 2023-03-17 LAB — CBC WITH DIFFERENTIAL/PLATELET
Basophils Absolute: 0 10*3/uL (ref 0.0–0.1)
Basophils Relative: 0.4 % (ref 0.0–3.0)
Eosinophils Absolute: 0.2 10*3/uL (ref 0.0–0.7)
Eosinophils Relative: 2.1 % (ref 0.0–5.0)
HCT: 32.5 % — ABNORMAL LOW (ref 36.0–46.0)
Hemoglobin: 10.4 g/dL — ABNORMAL LOW (ref 12.0–15.0)
Lymphocytes Relative: 12.6 % (ref 12.0–46.0)
Lymphs Abs: 1 10*3/uL (ref 0.7–4.0)
MCHC: 32 g/dL (ref 30.0–36.0)
MCV: 82.6 fl (ref 78.0–100.0)
Monocytes Absolute: 0.4 10*3/uL (ref 0.1–1.0)
Monocytes Relative: 5.3 % (ref 3.0–12.0)
Neutro Abs: 6.3 10*3/uL (ref 1.4–7.7)
Neutrophils Relative %: 79.6 % — ABNORMAL HIGH (ref 43.0–77.0)
Platelets: 306 10*3/uL (ref 150.0–400.0)
RBC: 3.94 Mil/uL (ref 3.87–5.11)
RDW: 18.5 % — ABNORMAL HIGH (ref 11.5–15.5)
WBC: 7.9 10*3/uL (ref 4.0–10.5)

## 2023-03-17 LAB — COMPREHENSIVE METABOLIC PANEL
ALT: 10 U/L (ref 0–35)
AST: 15 U/L (ref 0–37)
Albumin: 4.1 g/dL (ref 3.5–5.2)
Alkaline Phosphatase: 89 U/L (ref 39–117)
BUN: 10 mg/dL (ref 6–23)
CO2: 23 mEq/L (ref 19–32)
Calcium: 9.1 mg/dL (ref 8.4–10.5)
Chloride: 105 mEq/L (ref 96–112)
Creatinine, Ser: 0.69 mg/dL (ref 0.40–1.20)
GFR: 109.19 mL/min (ref >60.00)
Glucose, Bld: 89 mg/dL (ref 70–99)
Potassium: 4.1 mEq/L (ref 3.5–5.1)
Sodium: 138 mEq/L (ref 135–145)
Total Bilirubin: 0.5 mg/dL (ref 0.2–1.2)
Total Protein: 6.8 g/dL (ref 6.0–8.3)

## 2023-03-17 LAB — IBC + FERRITIN
Ferritin: 3.4 ng/mL — ABNORMAL LOW (ref 10.0–291.0)
Iron: 22 ug/dL — ABNORMAL LOW (ref 42–145)
Saturation Ratios: 4.5 % — ABNORMAL LOW (ref 20.0–50.0)
TIBC: 487.2 ug/dL — ABNORMAL HIGH (ref 250.0–450.0)
Transferrin: 348 mg/dL (ref 212.0–360.0)

## 2023-03-17 LAB — TSH: TSH: 1.87 u[IU]/mL (ref 0.35–5.50)

## 2023-03-17 LAB — VITAMIN B12: Vitamin B-12: 772 pg/mL (ref 211–911)

## 2023-03-17 NOTE — Progress Notes (Signed)
Subjective:     Patient ID: Stacey Moore, female    DOB: 03-Mar-1981, 42 y.o.   MRN: [id number]  Chief Complaint  Patient presents with   Transfer of Care    Transfer of Care Right armpit feels swollen Lower abdomen has hardened area, possibly the fibroid, pressure and urinary frequency at times Fasting for labs    HPI- April Mahoney cousin.  RN in cath lab  H/o B12 def.  On injections in past.  Takes otc. No tingling.  Poss some decreased sensation in fingertips Hard area lower abd.  Has known fibroid. Menses q 21d.  Heavy bleeding-lasts 1 wk, 2-3 days, changing super tampon q 1-2 hrs.  Some clots.   Seeing gyn but last visit 04/2022.  Can't do ocp d/t smoking. Considering surgery.   Urinary freq at times and urge.  Goes good amt.  Concerned as cousin cancer.  R axilla feels "different".  No pain.  Not sure how found it.  Going on 1 yr.   There are no preventive care reminders to display for this patient.  Past Medical History:  Diagnosis Date   Allergy    GERD (gastroesophageal reflux disease)     Past Surgical History:  Procedure Laterality Date   LIPOMA EXCISION N/A 04/25/2016   Procedure: EXCISION LIPOMA; back  Surgeon: Ralene Ok, MD;  Location: [facility];  Service: General;  Laterality: N/A;   WISDOM TOOTH EXTRACTION      Outpatient Medications Prior to Visit  Medication Sig Dispense Refill   Acidophilus Lactobacillus CAPS Take 1 tablet by mouth daily.     Ascorbic Acid (VITAMIN C) 100 MG CHEW Chew 0.5 tablets by mouth daily.     Cyanocobalamin (B-12 PO) Take by mouth.     LACTOBACILLUS RHAMNOSUS, GG, PO Take 1 tablet by mouth daily.     omeprazole (PRILOSEC) 20 MG capsule Take 20 mg by mouth daily.     valACYclovir (VALTREX) 1000 MG tablet Take 2 tabs (2 gram) and repeat in 12 hours with symptom onset. 30 tablet 1   L-Lysine 500 MG CAPS Take 500 mg by mouth daily.     metroNIDAZOLE (FLAGYL) 500 MG tablet Take 1 tablet (500 mg total) by mouth 2 (two) times  daily. 14 tablet 0   No facility-administered medications prior to visit.    No Known Allergies ROS neg/noncontributory except as noted HPI/below Occ intermitt, dull ha past 1 yr.  More "annoying".  About 2x/mo Occ allergy symptoms Occ skip beats-no cp No cough/sob No GI x some heartburn-prilosec most days-helps.  No SI.  Ex-boyfriend suicide on 08/13/20. Hadn't seen him in 75yrs.  Still processing.  In counseling       Objective:     BP 130/80   Pulse 85   Temp 98.3 F (36.8 C) (Temporal)   Ht 5\' 5"  (1.651 m)   Wt 167 lb 8 oz (76 kg)   LMP 03/16/2023 (Exact Date)   SpO2 99%   BMI 27.87 kg/m  Wt Readings from Last 3 Encounters:  03/17/23 167 lb 8 oz (76 kg)  05/14/22 177 lb 6.4 oz (80.5 kg)  11/27/21 180 lb 3.2 oz (81.7 kg)    Physical Exam   Gen: WDWN NAD WF HEENT: NCAT, conjunctiva not injected, sclera nonicteric NECK:  supple, no thyromegaly, no nodes, no carotid bruits CARDIAC: RRR, S1S2+, no murmur. DP 2+B LUNGS: CTAB. No wheezes ABDOMEN:  BS+, soft, sl tender lower abd, No HSM, + firm mass pelvis to  umbilicus EXT:  no edema MSK: no gross abnormalities.  NEURO: A&O x3.  CN II-XII intact.  PSYCH: normal mood. Good eye contact Breasts-no masses B.   R axilla-fullness-nondiscript.  Some tenderness.     Assessment & Plan:   Problem List Items Addressed This Visit       Digestive   GERD (gastroesophageal reflux disease)     Genitourinary   Intramural leiomyoma of uterus   Relevant Orders   US Pelvic Complete With Transvaginal     Other   Vitamin B12 deficiency   Relevant Orders   Vitamin B12   Menorrhagia with regular cycle - Primary   Relevant Orders   Comprehensive metabolic panel   CBC with Differential/Platelet   IBC + Ferritin   Vitamin B12   TSH   US Pelvic Complete With Transvaginal   Other Visit Diagnoses     Mass of right axilla       Relevant Orders   [place] AXILLA RIGHT     1.  Menorrhagia-has known fibroid.  Not sure if she has  any anemia.  Check CBC, CMP, iron studies, B12, TSH.  Check ultrasound pelvis 2.  Intramural leiomyoma of uterus-seems to have gotten larger.  Becoming more symptomatic.  Will check ultrasound of the pelvis.  Advised patient to schedule appointment with GYN. 3.  History of B12 deficiency-has not done injections for 1 year.  Check vitamin B12. 4.  GERD-chronic.  Mostly controlled on omeprazole 20 mg daily.  Continue. 5.  Fullness right axilla-not sure if just lipoma, other.  Will check ultrasound axilla.  Follow-up for annual physical  No orders of the defined types were placed in this encounter.   Wellington Hampshire, MD

## 2023-03-17 NOTE — Patient Instructions (Signed)
It was very nice to see you today!  Getting ultrasound lower abd and R axilla   PLEASE NOTE:  If you had any lab tests please let us know if you have not heard back within a few days. You may see your results on MyChart before we have a chance to review them but we will give you a call once they are reviewed by [place]. If we ordered any referrals today, please let us know if you have not heard from their office within the next week.   Please try these tips to maintain a healthy lifestyle:  Eat most of your calories during the day when you are active. Eliminate processed foods including packaged sweets (pies, cakes, cookies), reduce intake of potatoes, white bread, white pasta, and white rice. Look for whole grain options, oat flour or almond flour.  Each meal should contain half fruits/vegetables, one quarter protein, and one quarter carbs (no bigger than a computer mouse).  Cut down on sweet beverages. This includes juice, soda, and sweet tea. Also watch fruit intake, though this is a healthier sweet option, it still contains natural sugar! Limit to 3 servings daily.  Drink at least 1 glass of water with each meal and aim for at least 8 glasses per day  Exercise at least 150 minutes every week.

## 2023-03-18 ENCOUNTER — Other Ambulatory Visit: Payer: Self-pay | Admitting: Family Medicine

## 2023-03-18 ENCOUNTER — Encounter: Payer: 59 | Admitting: Family Medicine

## 2023-03-18 DIAGNOSIS — R2231 Localized swelling, mass and lump, right upper limb: Secondary | ICD-10-CM

## 2023-03-18 NOTE — Progress Notes (Signed)
Labs are great (including B12) except: 1.  No surprise, hemoglobin is low and iron is low.  She needs to take iron 325 mg twice daily.  Needs to follow-up with GYN.  May need to take stool softeners to prevent constipation from the iron.  Will repeat levels when I see her next month

## 2023-03-20 ENCOUNTER — Encounter: Payer: Self-pay | Admitting: *Deleted

## 2023-03-30 ENCOUNTER — Encounter (HOSPITAL_BASED_OUTPATIENT_CLINIC_OR_DEPARTMENT_OTHER): Payer: Self-pay | Admitting: Obstetrics & Gynecology

## 2023-04-21 ENCOUNTER — Encounter: Payer: Commercial Managed Care - PPO | Admitting: Family Medicine

## 2023-05-07 ENCOUNTER — Encounter: Payer: Self-pay | Admitting: Family Medicine

## 2023-05-07 ENCOUNTER — Ambulatory Visit (INDEPENDENT_AMBULATORY_CARE_PROVIDER_SITE_OTHER): Payer: Commercial Managed Care - PPO | Admitting: Family Medicine

## 2023-05-07 VITALS — BP 130/80 | HR 81 | Temp 98.1°F | Resp 18 | Ht 65.0 in | Wt 165.1 lb

## 2023-05-07 DIAGNOSIS — Z0000 Encounter for general adult medical examination without abnormal findings: Secondary | ICD-10-CM

## 2023-05-07 LAB — COMPREHENSIVE METABOLIC PANEL
ALT: 11 U/L (ref 0–35)
AST: 17 U/L (ref 0–37)
Albumin: 4.1 g/dL (ref 3.5–5.2)
Alkaline Phosphatase: 77 U/L (ref 39–117)
BUN: 13 mg/dL (ref 6–23)
CO2: 25 mEq/L (ref 19–32)
Calcium: 9.2 mg/dL (ref 8.4–10.5)
Chloride: 107 mEq/L (ref 96–112)
Creatinine, Ser: 0.77 mg/dL (ref 0.40–1.20)
GFR: 96.96 mL/min (ref >60.00)
Glucose, Bld: 81 mg/dL (ref 70–99)
Potassium: 4.2 mEq/L (ref 3.5–5.1)
Sodium: 140 mEq/L (ref 135–145)
Total Bilirubin: 0.8 mg/dL (ref 0.2–1.2)
Total Protein: 6.6 g/dL (ref 6.0–8.3)

## 2023-05-07 LAB — CBC WITH DIFFERENTIAL/PLATELET
Basophils Absolute: 0 10*3/uL (ref 0.0–0.1)
Basophils Relative: 0.6 % (ref 0.0–3.0)
Eosinophils Absolute: 0.1 10*3/uL (ref 0.0–0.7)
Eosinophils Relative: 1.2 % (ref 0.0–5.0)
HCT: 31.3 % — ABNORMAL LOW (ref 36.0–46.0)
Hemoglobin: 10.1 g/dL — ABNORMAL LOW (ref 12.0–15.0)
Lymphocytes Relative: 17.1 % (ref 12.0–46.0)
Lymphs Abs: 1 10*3/uL (ref 0.7–4.0)
MCHC: 32.2 g/dL (ref 30.0–36.0)
MCV: 82.5 fl (ref 78.0–100.0)
Monocytes Absolute: 0.3 10*3/uL (ref 0.1–1.0)
Monocytes Relative: 5.5 % (ref 3.0–12.0)
Neutro Abs: 4.5 10*3/uL (ref 1.4–7.7)
Neutrophils Relative %: 75.6 % (ref 43.0–77.0)
Platelets: 391 10*3/uL (ref 150.0–400.0)
RBC: 3.79 Mil/uL — ABNORMAL LOW (ref 3.87–5.11)
RDW: 19.9 % — ABNORMAL HIGH (ref 11.5–15.5)
WBC: 6 10*3/uL (ref 4.0–10.5)

## 2023-05-07 LAB — HEMOGLOBIN A1C: Hgb A1c MFr Bld: 5.9 % (ref 4.6–6.5)

## 2023-05-07 LAB — LIPID PANEL
Cholesterol: 156 mg/dL (ref 0–200)
HDL: 45 mg/dL (ref >39.00)
LDL Cholesterol: 96 mg/dL (ref 0–99)
NonHDL: 110.84
Total CHOL/HDL Ratio: 3
Triglycerides: 73 mg/dL (ref 0.0–149.0)
VLDL: 14.6 mg/dL (ref 0.0–40.0)

## 2023-05-07 NOTE — Patient Instructions (Addendum)
It was very nice to see you today!   [phone] for mammogram, [phone] other studies   Take iron twice daily 325mg (or 65 mg elemental) with vitamin C.  May need stool softener  Call gynecology.     PLEASE NOTE:  If you had any lab tests please let us know if you have not heard back within a few days. You may see your results on MyChart before we have a chance to review them but we will give you a call once they are reviewed by [place]. If we ordered any referrals today, please let us know if you have not heard from their office within the next week.   Please try these tips to maintain a healthy lifestyle:  Eat most of your calories during the day when you are active. Eliminate processed foods including packaged sweets (pies, cakes, cookies), reduce intake of potatoes, white bread, white pasta, and white rice. Look for whole grain options, oat flour or almond flour.  Each meal should contain half fruits/vegetables, one quarter protein, and one quarter carbs (no bigger than a computer mouse).  Cut down on sweet beverages. This includes juice, soda, and sweet tea. Also watch fruit intake, though this is a healthier sweet option, it still contains natural sugar! Limit to 3 servings daily.  Drink at least 1 glass of water with each meal and aim for at least 8 glasses per day  Exercise at least 150 minutes every week.

## 2023-05-07 NOTE — Progress Notes (Signed)
Phone [phone]   Subjective:   Patient is a 42 y.o. female presenting for annual physical.    Chief Complaint  Patient presents with   Annual Exam    CPE Fasting   Annual-didn't get studies done yet(was out of town and other).   Exercise 1/wk Didn't schedule gynecology Anemia-not taking iron.   Pica.  See problem oriented charting- ROS- ROS: Gen: no fever, chills  Skin: no rash, itching ENT: no ear pain, ear drainage, nasal congestion, rhinorrhea, sinus pressure, sore throat Eyes: no blurry vision, double vision Resp: no cough, wheeze,SOB CV: no CP, palpitations, LE edema,  GI: no heartburn, n/v/d/c, abd pain GU: no dysuria, urgency, frequency, hematuria.  Heavy menses MSK: no joint pain, myalgias, back pain Neuro: no dizziness,  weakness, vertigo.  Some headache(s) occ Psych: no depression, anxiety, insomnia, SI   The following were reviewed and entered/updated in epic: Past Medical History:  Diagnosis Date   Allergy    GERD (gastroesophageal reflux disease)    Patient Active Problem List   Diagnosis Date Noted   Fever blister 11/30/2021   Menorrhagia with regular cycle 11/30/2021   Intramural leiomyoma of uterus 11/30/2021   Tobacco use 02/23/2021   Vitamin B12 deficiency 02/23/2021   Bartholin cyst 07/31/2020   Lipoma 07/23/2015   GERD (gastroesophageal reflux disease) 07/23/2015   Allergic rhinitis 07/23/2015   Past Surgical History:  Procedure Laterality Date   LIPOMA EXCISION N/A 04/25/2016   Procedure: EXCISION LIPOMA; back  Surgeon: Axel Filler, MD;  Location: [facility] OR;  Service: General;  Laterality: N/A;   WISDOM TOOTH EXTRACTION      Family History  Problem Relation Age of Onset   Hyperlipidemia Mother    Lung cancer Maternal Grandfather    Breast cancer Paternal Grandmother        PMP, diagnosis in her 63s?    Medications- reviewed and updated Current Outpatient Medications  Medication Sig Dispense Refill   Cyanocobalamin (B-12 PO)  Take by mouth.     omeprazole (PRILOSEC) 20 MG capsule Take 20 mg by mouth daily.     valACYclovir (VALTREX) 1000 MG tablet Take 2 tabs (2 gram) and repeat in 12 hours with symptom onset. 30 tablet 1   Ascorbic Acid (VITAMIN C) 100 MG CHEW Chew 0.5 tablets by mouth daily. (Patient not taking: Reported on 05/07/2023)     No current facility-administered medications for this visit.    Allergies-reviewed and updated No Known Allergies  Social History   Social History Narrative   Not on file   Objective  Objective:  BP 130/80   Pulse 81   Temp 98.1 F (36.7 C) (Temporal)   Resp 18   Ht 5\' 5"  (1.651 m)   Wt 165 lb 2 oz (74.9 kg)   LMP 05/01/2023 (Exact Date)   SpO2 98%   BMI 27.48 kg/m  Physical Exam  Gen: WDWN NAD HEENT: NCAT, conjunctiva not injected, sclera nonicteric TM WNL B, OP moist, no exudates  NECK:  supple, no thyromegaly, no nodes, no carotid bruits CARDIAC: RRR, S1S2+, no murmur. DP 2+B LUNGS: CTAB. No wheezes ABDOMEN:  BS+, soft, NTND, No HSM,+ mass lower abdomen to umbilicus EXT:  no edema MSK: no gross abnormalities. MS 5/5 all 4 NEURO: A&O x3.  CN II-XII intact.  PSYCH: normal mood. Good eye contact      Assessment and Plan   Health Maintenance counseling: 1. Anticipatory guidance: Patient counseled regarding regular dental exams q6 months, eye exams,  avoiding smoking  and second hand smoke, limiting alcohol to 1 beverage per day, no illicit drugs.   2. Risk factor reduction:  Advised patient of need for regular exercise and diet rich and fruits and vegetables to reduce risk of heart attack and stroke. Exercise- work on it.  Wt Readings from Last 3 Encounters:  05/07/23 165 lb 2 oz (74.9 kg)  03/17/23 167 lb 8 oz (76 kg)  05/14/22 177 lb 6.4 oz (80.5 kg)   3. Immunizations/screenings/ancillary studies Immunization History  Administered Date(s) Administered   HPV 9-valent 01/23/2021, 03/26/2021, 05/14/2022   Influenza,inj,Quad PF,6+ Mos 10/26/2019    Influenza-Unspecified 09/29/2015, 10/16/2016, 09/04/2021, 10/17/2022   Janssen (J&J) SARS-COV-2 Vaccination 09/13/2020   Moderna Covid-19 Vaccine Bivalent Booster 53yrs & up 01/14/2022   Tdap 12/30/2007, 04/23/2018   There are no preventive care reminders to display for this patient.  4. Cervical cancer screening: will do at gyn 5. Skin cancer screening- advised regular sunscreen use. Denies worrisome, changing, or new skin lesions.  6. Birth control/STD check: condoms 7. Smoking associated screening: + smoker 8. Alcohol screening: none  Wellness examination -     CBC with Differential/Platelet -     Comprehensive metabolic panel -     Lipid panel -     Hemoglobin A1c  Wellness-anticipatory guidance.  Work on Diet/Exercise  Check CBC,CMP,lipids,TSH, A1C.  F/u 1 yr  Abdomen mass, heavy menses, anemia-see gynecology    Recommended follow up: Return in about 1 year (around 05/06/2024) for annual physical   labs in 2 months.  Lab/Order associations:+ fasting   Angelena Sole, MD

## 2023-05-07 NOTE — Progress Notes (Signed)
Labs ok except: 1.  A1C(3 month average of sugars) is elevated.  This is considered PreDiabetes.  Work on diet-decrease sugars and starches and aim for 30 minutes of exercise 5 days/week to prevent progression to diabetes  2.  Anemia about the same.  Start iron as we discussed.  Reck cbcd,iron studies in 2 months

## 2023-05-08 ENCOUNTER — Other Ambulatory Visit: Payer: Self-pay | Admitting: *Deleted

## 2023-05-08 DIAGNOSIS — D649 Anemia, unspecified: Secondary | ICD-10-CM

## 2023-06-03 ENCOUNTER — Ambulatory Visit (HOSPITAL_BASED_OUTPATIENT_CLINIC_OR_DEPARTMENT_OTHER): Payer: Commercial Managed Care - PPO | Admitting: Obstetrics & Gynecology

## 2023-06-03 ENCOUNTER — Encounter (HOSPITAL_BASED_OUTPATIENT_CLINIC_OR_DEPARTMENT_OTHER): Payer: Self-pay | Admitting: Obstetrics & Gynecology

## 2023-06-03 ENCOUNTER — Ambulatory Visit (INDEPENDENT_AMBULATORY_CARE_PROVIDER_SITE_OTHER): Payer: Commercial Managed Care - PPO

## 2023-06-03 VITALS — BP 126/71 | HR 79 | Ht 64.0 in | Wt 163.2 lb

## 2023-06-03 DIAGNOSIS — B001 Herpesviral vesicular dermatitis: Secondary | ICD-10-CM | POA: Diagnosis not present

## 2023-06-03 DIAGNOSIS — N920 Excessive and frequent menstruation with regular cycle: Secondary | ICD-10-CM | POA: Diagnosis not present

## 2023-06-03 DIAGNOSIS — D251 Intramural leiomyoma of uterus: Secondary | ICD-10-CM

## 2023-06-03 MED ORDER — VALACYCLOVIR HCL 1 G PO TABS: ORAL_TABLET | ORAL | 1 refills | Status: DC

## 2023-06-06 ENCOUNTER — Encounter (HOSPITAL_BASED_OUTPATIENT_CLINIC_OR_DEPARTMENT_OTHER): Payer: Self-pay | Admitting: Obstetrics & Gynecology

## 2023-06-06 NOTE — Progress Notes (Signed)
GYNECOLOGY  VISIT  CC:   discuss ultrasound, history of fibroids, menorrhagia  HPI: 42 y.o. G0P0000 Single White female here for discussion of ultrasound that was done due to worsening menorrhagia and h/o fibroid uterus.  Uterus measured 18.x x 15.5 x 9.9cm with single large fibroid measuring 18.2 x 15.5 x 9.9cm.  Volume 1465.  Single large fibroid noted measuring 13.6 x 12. 4 x 7.6cm.  Ovaries normal.  Endometrium not well visualized due to the size of the fibroid.  Although pt not in serious relationship, she does desire to keep her uterus if possible and not have a hysterectomy.  Am going to need to refer her for myomectomy and will refer to Dr. April Manson.  Feel she is likely going to need an MRI.  Discussed depo lupron use to shrink fibroid prior to surgery.  This will help with bleeding and with anemia as well.  Most recent hb was 10.1 on 05/07/2023.  Medication side effects discussed.    Separate and unrelated, pt does need RF for valtrex for fever blisters.    Past Medical History:  Diagnosis Date   Allergy    GERD (gastroesophageal reflux disease)     MEDS:   Current Outpatient Medications on File Prior to Visit  Medication Sig Dispense Refill   Cyanocobalamin (B-12 PO) Take by mouth.     omeprazole (PRILOSEC) 20 MG capsule Take 20 mg by mouth daily.     Ascorbic Acid (VITAMIN C) 100 MG CHEW Chew 0.5 tablets by mouth daily. (Patient not taking: Reported on 06/03/2023)     No current facility-administered medications on file prior to visit.    ALLERGIES: Patient has no known allergies.  SH:  single, smoking cigarettes  Review of Systems  Constitutional: Negative.   Genitourinary:        Menorrhagia    PHYSICAL EXAMINATION:    BP 126/71 (BP Location: Left Arm, Patient Position: Sitting, Cuff Size: Large)   Pulse 79   Ht 5\' 4"  (1.626 m) Comment: Reported  Wt 163 lb 3.2 oz (74 kg)   LMP 05/01/2023 (Exact Date)   BMI 28.01 kg/m     General appearance: alert, cooperative  and appears stated age  Abdomen: soft, non-tender; bowel sounds normal; pelvis mass can be felt right at umbilicus (mobile),  no organomegaly  Assessment/Plan: 1. Intramural uterine fibroid - will reach out to Dr.Yalcinkaya to discuss plan of care.  Will need to refer and suspect will need MRI and Depo Lupron use.  Will communicate with pt once I have heard back from Dr. April Manson.  2. Fever blister - valACYclovir (VALTREX) 1000 MG tablet; Take 2 tabs (2 gram) and repeat in 12 hours with symptom onset.  Dispense: 30 tablet; Refill: 1

## 2023-06-06 NOTE — Addendum Note (Signed)
Addended by: Jerene Bears on: 06/06/2023 10:06 PM   Modules accepted: Level of Service

## 2023-07-09 ENCOUNTER — Encounter (HOSPITAL_BASED_OUTPATIENT_CLINIC_OR_DEPARTMENT_OTHER): Payer: Self-pay | Admitting: Obstetrics & Gynecology

## 2023-07-29 ENCOUNTER — Telehealth (HOSPITAL_BASED_OUTPATIENT_CLINIC_OR_DEPARTMENT_OTHER): Payer: Self-pay | Admitting: *Deleted

## 2023-07-29 NOTE — Telephone Encounter (Signed)
LMOVM for pt to call for recommendations. 

## 2023-09-08 ENCOUNTER — Other Ambulatory Visit (HOSPITAL_BASED_OUTPATIENT_CLINIC_OR_DEPARTMENT_OTHER): Payer: Self-pay | Admitting: Obstetrics & Gynecology

## 2023-09-08 ENCOUNTER — Other Ambulatory Visit (INDEPENDENT_AMBULATORY_CARE_PROVIDER_SITE_OTHER): Payer: Commercial Managed Care - PPO

## 2023-09-08 ENCOUNTER — Other Ambulatory Visit: Payer: Self-pay | Admitting: *Deleted

## 2023-09-08 DIAGNOSIS — D251 Intramural leiomyoma of uterus: Secondary | ICD-10-CM

## 2023-09-08 DIAGNOSIS — D649 Anemia, unspecified: Secondary | ICD-10-CM

## 2023-09-08 LAB — CBC WITH DIFFERENTIAL/PLATELET
Basophils Absolute: 0 10*3/uL (ref 0.0–0.1)
Basophils Relative: 0.8 % (ref 0.0–3.0)
Eosinophils Absolute: 0.2 10*3/uL (ref 0.0–0.7)
Eosinophils Relative: 3.4 % (ref 0.0–5.0)
HCT: 39.4 % (ref 36.0–46.0)
Hemoglobin: 13 g/dL (ref 12.0–15.0)
Lymphocytes Relative: 21.1 % (ref 12.0–46.0)
Lymphs Abs: 1.2 10*3/uL (ref 0.7–4.0)
MCHC: 33.1 g/dL (ref 30.0–36.0)
MCV: 99.7 fl (ref 78.0–100.0)
Monocytes Absolute: 0.2 10*3/uL (ref 0.1–1.0)
Monocytes Relative: 4.4 % (ref 3.0–12.0)
Neutro Abs: 4 10*3/uL (ref 1.4–7.7)
Neutrophils Relative %: 70.3 % (ref 43.0–77.0)
Platelets: 315 10*3/uL (ref 150.0–400.0)
RBC: 3.95 Mil/uL (ref 3.87–5.11)
RDW: 13.8 % (ref 11.5–15.5)
WBC: 5.6 10*3/uL (ref 4.0–10.5)

## 2023-09-08 LAB — IBC + FERRITIN
Ferritin: 26.4 ng/mL (ref 10.0–291.0)
Iron: 40 ug/dL — ABNORMAL LOW (ref 42–145)
Saturation Ratios: 12.1 % — ABNORMAL LOW (ref 20.0–50.0)
TIBC: 330.4 ug/dL (ref 250.0–450.0)
Transferrin: 236 mg/dL (ref 212.0–360.0)

## 2023-09-08 LAB — VITAMIN B12: Vitamin B-12: 297 pg/mL (ref 211–911)

## 2023-09-08 MED ORDER — LEUPROLIDE ACETATE 3.75 MG IM KIT: 3.7500 mg | PACK | INTRAMUSCULAR | 5 refills | Status: DC

## 2023-09-08 NOTE — Addendum Note (Signed)
Addended by: Lorn Junes on: 09/08/2023 10:35 AM   Modules accepted: Orders

## 2023-09-08 NOTE — Progress Notes (Signed)
Anemia has improved.  Continue iron.  Also, B12 has decreased.  Is she taking 2000 mcg daily?  Please schedule appointment to see me in November/December to reevaluate.

## 2023-09-16 ENCOUNTER — Ambulatory Visit: Payer: Commercial Managed Care - PPO | Admitting: Physician Assistant

## 2023-09-16 ENCOUNTER — Encounter: Payer: Self-pay | Admitting: Physician Assistant

## 2023-09-16 VITALS — BP 121/81 | HR 68 | Temp 97.7°F | Ht 64.0 in | Wt 158.0 lb

## 2023-09-16 DIAGNOSIS — M79671 Pain in right foot: Secondary | ICD-10-CM | POA: Diagnosis not present

## 2023-09-16 DIAGNOSIS — M79672 Pain in left foot: Secondary | ICD-10-CM

## 2023-09-16 NOTE — Progress Notes (Signed)
Stacey Moore is a 42 y.o. female here for a new problem.  History of Present Illness:   Chief Complaint  Patient presents with   Foot Sore     Painful to walk on without shoes, Rt/Lt foot,x3wks,     HPI  Sores Bottom of Both Feet Notes sore spots on left and right feet for 3 weeks. She has had warts in the past that resolved after using Compound W. Reports she had the HPV vaccine series two years ago. She is on her feet frequently for her job. Denies discharge from sores, fevers/chills.  Past Medical History:  Diagnosis Date   Allergy    GERD (gastroesophageal reflux disease)      Social History   Tobacco Use   Smoking status: Every Day    Current packs/day: 0.00    Types: Cigars, Cigarettes    Last attempt to quit: 2018    Years since quitting: 6.7   Smokeless tobacco: Never   Tobacco comments:    socially   Vaping Use   Vaping status: Never Used  Substance Use Topics   Alcohol use: Yes    Comment: rare   Drug use: No    Past Surgical History:  Procedure Laterality Date   LIPOMA EXCISION N/A 04/25/2016   Procedure: EXCISION LIPOMA; back  Surgeon: Axel Filler, MD;  Location: [facility] OR;  Service: General;  Laterality: N/A;   WISDOM TOOTH EXTRACTION      Family History  Problem Relation Age of Onset   Hyperlipidemia Mother    Lung cancer Maternal Grandfather    Breast cancer Paternal Grandmother        PMP, diagnosis in her 29s?    No Known Allergies  Current Medications:   Current Outpatient Medications:    Ascorbic Acid (VITAMIN C) 100 MG CHEW, Chew 0.5 tablets by mouth daily., Disp: , Rfl:    Cyanocobalamin (B-12 PO), Take by mouth., Disp: , Rfl:    ferrous sulfate 324 MG TBEC, Take 324 mg by mouth 1 day or 1 dose., Disp: , Rfl:    omeprazole (PRILOSEC) 20 MG capsule, Take 20 mg by mouth daily., Disp: , Rfl:    valACYclovir (VALTREX) 1000 MG tablet, Take 2 tabs (2 gram) and repeat in 12 hours with symptom onset., Disp: 30 tablet, Rfl: 1    leuprolide (LUPRON) 3.75 MG injection, Inject 3.75 mg into the muscle every 28 (twenty-eight) days. (Patient not taking: Reported on 09/16/2023), Disp: 1 each, Rfl: 5   Review of Systems:   Review of Systems  Constitutional:  Negative for fever and malaise/fatigue.  HENT:  Negative for congestion.   Eyes:  Negative for blurred vision.  Respiratory:  Negative for cough and shortness of breath.   Cardiovascular:  Negative for chest pain, palpitations and leg swelling.  Gastrointestinal:  Negative for vomiting.  Musculoskeletal:  Negative for back pain.  Skin:  Negative for rash.       (+) Sores bottom of both feet  Neurological:  Negative for loss of consciousness and headaches.    Vitals:   Vitals:   09/16/23 1129  BP: 121/81  Pulse: 68  Temp: 97.7 F (36.5 C)  TempSrc: Temporal  SpO2: 98%  Weight: 158 lb (71.7 kg)  Height: 5\' 4"  (1.626 m)     Body mass index is 27.12 kg/m.  Physical Exam:   Physical Exam Vitals and nursing note reviewed.  Constitutional:      General: She is not in acute distress.  Appearance: She is well-developed. She is not ill-appearing or toxic-appearing.  Cardiovascular:     Rate and Rhythm: Normal rate and regular rhythm.     Pulses: Normal pulses.     Heart sounds: Normal heart sounds, S1 normal and S2 normal.  Pulmonary:     Effort: Pulmonary effort is normal.     Breath sounds: Normal breath sounds.  Feet:     Comments: Two calloused lesions to plantar aspect of left foot and one to right foot with tenderness to palpation  Skin:    General: Skin is warm and dry.  Neurological:     Mental Status: She is alert.     GCS: GCS eye subscore is 4. GCS verbal subscore is 5. GCS motor subscore is 6.  Psychiatric:        Speech: Speech normal.        Behavior: Behavior normal. Behavior is cooperative.     Assessment and Plan:   Foot pain, bilateral Suspect thickened corn/callus Agreeable to podiatry referral Follow-up based on  results  I,Alexander Ruley,acting as a scribe for Jarold Motto, PA.,have documented all relevant documentation on the behalf of Jarold Motto, PA,as directed by  Jarold Motto, PA while in the presence of Jarold Motto, [place].  I, Jarold Motto, [place], have reviewed all documentation for this visit. The documentation on 09/16/23 for the exam, diagnosis, procedures, and orders are all accurate and complete.  Jarold Motto, PA-C

## 2023-09-24 ENCOUNTER — Encounter: Payer: Self-pay | Admitting: Physician Assistant

## 2023-09-24 ENCOUNTER — Ambulatory Visit (INDEPENDENT_AMBULATORY_CARE_PROVIDER_SITE_OTHER): Payer: Commercial Managed Care - PPO | Admitting: Physician Assistant

## 2023-09-24 VITALS — BP 130/80 | HR 88 | Temp 97.5°F | Ht 64.0 in | Wt 157.0 lb

## 2023-09-24 DIAGNOSIS — M79671 Pain in right foot: Secondary | ICD-10-CM

## 2023-09-24 DIAGNOSIS — M79672 Pain in left foot: Secondary | ICD-10-CM

## 2023-09-24 NOTE — Progress Notes (Signed)
Patient was here for office visit to follow-up on foot for possible cryotherapy  Due to the nature of her symptoms, do not feel this is appropriate  Recommended corn pads and liquid remover such as salicylic acid  Follow-up with podiatry if needed  No charge

## 2023-10-12 DIAGNOSIS — R102 Pelvic and perineal pain: Secondary | ICD-10-CM | POA: Diagnosis not present

## 2023-10-12 DIAGNOSIS — D251 Intramural leiomyoma of uterus: Secondary | ICD-10-CM | POA: Diagnosis not present

## 2023-10-16 ENCOUNTER — Other Ambulatory Visit (HOSPITAL_COMMUNITY): Payer: Self-pay

## 2023-10-20 ENCOUNTER — Other Ambulatory Visit (HOSPITAL_COMMUNITY): Payer: Self-pay

## 2023-10-20 ENCOUNTER — Emergency Department (HOSPITAL_COMMUNITY)
Admission: EM | Admit: 2023-10-20 | Discharge: 2023-10-20 | Disposition: A | Discharge status: Home / Self Care | Payer: Commercial Managed Care - PPO | Attending: Emergency Medicine | Admitting: Emergency Medicine

## 2023-10-20 ENCOUNTER — Other Ambulatory Visit: Payer: Self-pay

## 2023-10-20 ENCOUNTER — Encounter (HOSPITAL_COMMUNITY): Payer: Self-pay

## 2023-10-20 DIAGNOSIS — T7840XA Allergy, unspecified, initial encounter: Secondary | ICD-10-CM | POA: Diagnosis present

## 2023-10-20 DIAGNOSIS — L509 Urticaria, unspecified: Secondary | ICD-10-CM | POA: Diagnosis not present

## 2023-10-20 DIAGNOSIS — L500 Allergic urticaria: Secondary | ICD-10-CM | POA: Insufficient documentation

## 2023-10-20 HISTORY — DX: Benign neoplasm of connective and other soft tissue, unspecified: D21.9

## 2023-10-20 MED ORDER — LORATADINE 10 MG PO TABS
10.0000 mg | ORAL_TABLET | Freq: Every day | ORAL | Status: DC
  Administered 2023-10-20: 10 mg via ORAL
  Filled 2023-10-20: qty 1

## 2023-10-20 MED ORDER — HYDROXYZINE HCL 25 MG PO TABS: 25.0000 mg | ORAL_TABLET | Freq: Four times a day (QID) | ORAL | 0 refills | Status: DC | PRN

## 2023-10-20 MED ORDER — DIPHENHYDRAMINE HCL 25 MG PO CAPS
25.0000 mg | ORAL_CAPSULE | Freq: Once | ORAL | Status: AC
  Administered 2023-10-20: 25 mg via ORAL
  Filled 2023-10-20: qty 1

## 2023-10-20 NOTE — ED Provider Notes (Signed)
[place] EMERGENCY DEPARTMENT AT [facility] Provider Note   CSN: [id number] Arrival date & time: 10/20/23  0006     History  Chief Complaint  Patient presents with   Allergic Reaction    GLADIES UNGAR is a 42 y.o. female.  The history is provided by the patient.  Allergic Reaction KASUMI FOULKES is a 42 y.o. female who presents to the Emergency Department complaining of itching.  She presents emergency department for evaluation of an itchy rash that started on Friday under her bra line.  On Monday it spread to more of her chest, neck and bilateral hips.  She did try Benadryl cream but it is not significantly improving.  She did travel to [place] 2 to 3 weeks ago, but did not have a rash at that time.  No new medications.  No nausea, vomiting, fever, chest pain.  She did have URI symptoms 3 weeks ago, but these were mild.  No new foods, detergents or lotions.  No prior similar symptoms.     Home Medications Prior to Admission medications   Medication Sig Start Date End Date Taking? Authorizing Provider  hydrOXYzine (ATARAX) 25 MG tablet Take 1 tablet (25 mg total) by mouth every 6 (six) hours as needed. 10/20/23  Yes Tilden Fossa, MD  Ascorbic Acid (VITAMIN C) 100 MG CHEW Chew 0.5 tablets by mouth daily.    [provider]  Cyanocobalamin (B-12 PO) Take by mouth.    [provider]  ferrous sulfate 324 MG TBEC Take 324 mg by mouth 1 day or 1 dose.    [provider]  leuprolide (LUPRON) 3.75 MG injection Inject 3.75 mg into the muscle every 28 (twenty-eight) days. Patient not taking: Reported on 09/16/2023 09/08/23   Jerene Bears, MD  omeprazole (PRILOSEC) 20 MG capsule Take 20 mg by mouth daily.    [provider]  valACYclovir (VALTREX) 1000 MG tablet Take 2 tabs (2 gram) and repeat in 12 hours with symptom onset. 06/03/23   Jerene Bears, MD      Allergies    Patient has no known allergies.    Review of Systems    Review of Systems  All other systems reviewed and are negative.   Physical Exam Updated Vital Signs BP 128/80   Pulse 73   Temp 97.9 F (36.6 C)   Resp 15   Ht 5\' 4"  (1.626 m)   Wt 74.8 kg   LMP 10/20/2023 (Approximate)   SpO2 100%   BMI 28.32 kg/m  Physical Exam Vitals and nursing note reviewed.  Constitutional:      Appearance: She is well-developed.  HENT:     Head: Normocephalic and atraumatic.  Cardiovascular:     Rate and Rhythm: Normal rate and regular rhythm.     Heart sounds: No murmur heard. Pulmonary:     Effort: Pulmonary effort is normal. No respiratory distress.     Breath sounds: Normal breath sounds.  Abdominal:     Palpations: Abdomen is soft.     Tenderness: There is no abdominal tenderness. There is no guarding or rebound.  Musculoskeletal:        General: No tenderness.  Skin:    General: Skin is warm and dry.     Comments: Scattered urticaria under the breast bilaterally.  There are patches of urticaria to bilateral hips, inner thighs.  Neurological:     Mental Status: She is alert and oriented to person, place, and time.  Psychiatric:        Behavior: Behavior normal.     ED Results / Procedures / Treatments   Labs (all labs ordered are listed, but only abnormal results are displayed) Labs Reviewed - No data to display  EKG None  Radiology No results found.  Procedures Procedures    Medications Ordered in ED Medications  loratadine (CLARITIN) tablet 10 mg (10 mg Oral Given 10/20/23 0610)  diphenhydrAMINE (BENADRYL) capsule 25 mg (25 mg Oral Given 10/20/23 0125)    ED Course/ Medical Decision Making/ A&P                                 Medical Decision Making Risk OTC drugs. Prescription drug management.   Patient here for evaluation of urticaria, no clear triggers.  She is well-perfused on examination.  No evidence of secondary infection, anaphylaxis.  Discussed with patient unclear source of her urticaria.  Discussed  home care, outpatient follow-up and return precautions.        Final Clinical Impression(s) / ED Diagnoses Final diagnoses:  Urticaria    Rx / DC Orders ED Discharge Orders          Ordered    hydrOXYzine (ATARAX) 25 MG tablet  Every 6 hours PRN        10/20/23 0558              Tilden Fossa, MD 10/20/23 [phone]

## 2023-10-20 NOTE — ED Triage Notes (Signed)
Pt arrived POV for allergic reaction, generalized rash to torso, under breast, to cheeks, bilateral arms and legs. Pt reports used a new shampoo & conditioner on Sunday. Pt has used benadryl cream, w/o relief. Airway patent, no difficulty breathing or swallowing. Talking in complete sentences. VSS, A&O x4.

## 2023-10-26 ENCOUNTER — Other Ambulatory Visit (HOSPITAL_COMMUNITY): Payer: Self-pay

## 2023-10-30 ENCOUNTER — Other Ambulatory Visit: Payer: Self-pay

## 2023-11-11 ENCOUNTER — Ambulatory Visit: Payer: Commercial Managed Care - PPO | Admitting: Family Medicine

## 2023-11-16 ENCOUNTER — Encounter (HOSPITAL_BASED_OUTPATIENT_CLINIC_OR_DEPARTMENT_OTHER): Payer: Self-pay | Admitting: Obstetrics & Gynecology

## 2023-11-17 ENCOUNTER — Encounter: Payer: Self-pay | Admitting: Family Medicine

## 2023-11-17 ENCOUNTER — Ambulatory Visit: Payer: Commercial Managed Care - PPO | Admitting: Family Medicine

## 2023-11-17 VITALS — BP 121/82 | HR 87 | Temp 98.7°F | Resp 18 | Ht 64.0 in | Wt 156.5 lb

## 2023-11-17 DIAGNOSIS — D649 Anemia, unspecified: Secondary | ICD-10-CM | POA: Diagnosis not present

## 2023-11-17 DIAGNOSIS — L509 Urticaria, unspecified: Secondary | ICD-10-CM | POA: Diagnosis not present

## 2023-11-17 DIAGNOSIS — R7303 Prediabetes: Secondary | ICD-10-CM | POA: Insufficient documentation

## 2023-11-17 DIAGNOSIS — E538 Deficiency of other specified B group vitamins: Secondary | ICD-10-CM | POA: Diagnosis not present

## 2023-11-17 NOTE — Patient Instructions (Signed)
It was very nice to see you today!  Referral to allergist   PLEASE NOTE:  If you had any lab tests please let us know if you have not heard back within a few days. You may see your results on MyChart before we have a chance to review them but we will give you a call once they are reviewed by [place]. If we ordered any referrals today, please let us know if you have not heard from their office within the next week.   Please try these tips to maintain a healthy lifestyle:  Eat most of your calories during the day when you are active. Eliminate processed foods including packaged sweets (pies, cakes, cookies), reduce intake of potatoes, white bread, white pasta, and white rice. Look for whole grain options, oat flour or almond flour.  Each meal should contain half fruits/vegetables, one quarter protein, and one quarter carbs (no bigger than a computer mouse).  Cut down on sweet beverages. This includes juice, soda, and sweet tea. Also watch fruit intake, though this is a healthier sweet option, it still contains natural sugar! Limit to 3 servings daily.  Drink at least 1 glass of water with each meal and aim for at least 8 glasses per day  Exercise at least 150 minutes every week.

## 2023-11-17 NOTE — Progress Notes (Signed)
Subjective:     Patient ID: Stacey Moore, female    DOB: [birth year], 42 y.o.   MRN: [id number]  Chief Complaint  Patient presents with   Follow-up    Follow-up from last appointment Had really bad outbreak of hives last week Discuss recommendation for surgeon to have fibroid removed    HPI  Hives, diffusely - She reports a breakout of hives diffusely (ears, scalp, under breasts, hips, inner thighs, and backs of knees) with associated rash, and itching. Sx started about a month ago and she presented to the ED on 10/22 after experiencing facial swelling/tinging. Prior to visiting the ED she tried oral Benadryl with no relief. After discharged from the ED, she takes Zyrtec and Benadryl as needed. She took rx hydroxyzine one night, notes she only took it once. She reports trying 2 different cheeses prior to her hives spreading, though she notes she eats cheese often. She also used a new shampoo/condition prior to onset of sx, states she has not used those products since then. Currently, she still endorses some hives and itching on her hips and under her breasts. There were also a couple of times this week she had to take Zyrtec to relieve sx. 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 No known allergies. She takes Valtrex as needed, about once a month - "if that". But more recent episode of hives, second day of valtrex  Fibroids / Anemia - She endorses heavy bleeding. Pt states she has been weighing her options of having a hysterectomy or not with removing her fibroid. After self-reflecting after consults at fertility clinic, she has decided to have hysterectomy rather than myomectomy. She is established with OBGYN. Has not had lupron shot. Denies any cardiac sx. Able to walk up a flight of stairs with out cp/sob   PreDM - She has been practicing more  mindful eating habits, though she has no diet restrictions. She also has been increasing exercise.   There are no preventive care reminders to display for this patient.  Past Medical History:  Diagnosis Date   Allergy    Fibroid    GERD (gastroesophageal reflux disease)     Past Surgical History:  Procedure Laterality Date   LIPOMA EXCISION N/A 04/25/2016   Procedure: EXCISION LIPOMA; back  Surgeon: Axel Filler, MD;  Location: [facility] OR;  Service: General;  Laterality: N/A;   WISDOM TOOTH EXTRACTION       Current Outpatient Medications:    Ascorbic Acid (VITAMIN C) 100 MG CHEW, Chew 0.5 tablets by mouth daily., Disp: , Rfl:    cyanocobalamin (VITAMIN B12) 1000 MCG tablet, Take by mouth., Disp: , Rfl:    ferrous sulfate 324 MG TBEC, Take 324 mg by mouth 1 day or 1 dose., Disp: , Rfl:    hydrOXYzine (ATARAX) 25 MG tablet, Take 1 tablet (25 mg total) by mouth every 6 (six) hours as needed., Disp: 12 tablet, Rfl: 0   omeprazole (PRILOSEC) 20 MG capsule, Take 20 mg by mouth daily., Disp: , Rfl:    valACYclovir (VALTREX) 1000 MG tablet, Take 2 tabs (2 gram) and repeat in 12 hours with symptom onset., Disp: 30 tablet, Rfl: 1  No Known Allergies ROS neg/noncontributory except as noted HPI/below      Objective:     BP 121/82   Pulse 87  Temp 98.7 F (37.1 C) (Temporal)   Resp 18   Ht 5\' 4"  (1.626 m)   Wt 156 lb 8 oz (71 kg)   LMP 10/20/2023 (Approximate)   SpO2 97%   BMI 26.86 kg/m  Wt Readings from Last 3 Encounters:  11/17/23 156 lb 8 oz (71 kg)  10/20/23 165 lb (74.8 kg)  09/24/23 157 lb (71.2 kg)    Physical Exam   Gen: WDWN NAD HEENT: NCAT, conjunctiva not injected, sclera nonicteric NECK:  supple, no thyromegaly, no nodes, no carotid bruits CARDIAC: RRR, S1S2+, no murmur. DP 2+B LUNGS: CTAB. No wheezes ABDOMEN:  BS+, soft, NTND, No HSM, no masses. +Uterus past umbilicus.  EXT:  no edema MSK: no gross abnormalities.  NEURO: A&O x3.  CN II-XII intact.   PSYCH: normal mood. Good eye contact     Assessment & Plan:  Anemia, unspecified type -     CBC with Differential/Platelet -     Vitamin B12  Vitamin B12 deficiency -     Vitamin B12  Prediabetes -     Hemoglobin A1c -     Comprehensive metabolic panel  Hives -     Ambulatory referral to Allergy   Anemia-iron deficiency from chronic blood loss from very large fibroid-on Fe-hgb 13 in Sept but continues w/blood loss.  Will reck cbc.  Pt in contact w/gyn re decision for hysterectomy. B12 deficiency-itaking B12 supps.  Check labs PreDM-working on more mindful eating and trying to get some exercise.  Check labs Hives-etiolol uncertain.  Keep log of food/drink/meds/etc.  Declined epi pen for now.  Refer allergist  Return in about 6 months (around 05/16/2024) for annual physical.   I, Isabelle Course, acting as a scribe for Angelena Sole, MD., have documented all relevant documentation on the behalf of Angelena Sole, MD, as directed by  Angelena Sole, MD while in the presence of Angelena Sole, MD.  I, Angelena Sole, MD, have reviewed all documentation for this visit. The documentation on 11/17/23 for the exam, diagnosis, procedures, and orders are all accurate and complete.  Angelena Sole, MD

## 2023-11-18 LAB — COMPREHENSIVE METABOLIC PANEL
ALT: 15 U/L (ref 0–35)
AST: 20 U/L (ref 0–37)
Albumin: 4.1 g/dL (ref 3.5–5.2)
Alkaline Phosphatase: 62 U/L (ref 39–117)
BUN: 11 mg/dL (ref 6–23)
CO2: 25 meq/L (ref 19–32)
Calcium: 9.1 mg/dL (ref 8.4–10.5)
Chloride: 107 meq/L (ref 96–112)
Creatinine, Ser: 0.73 mg/dL (ref 0.40–1.20)
GFR: 102.98 mL/min (ref >60.00)
Glucose, Bld: 91 mg/dL (ref 70–99)
Potassium: 4.1 meq/L (ref 3.5–5.1)
Sodium: 139 meq/L (ref 135–145)
Total Bilirubin: 0.4 mg/dL (ref 0.2–1.2)
Total Protein: 6.5 g/dL (ref 6.0–8.3)

## 2023-11-18 LAB — CBC WITH DIFFERENTIAL/PLATELET
Basophils Absolute: 0.1 10*3/uL (ref 0.0–0.1)
Basophils Relative: 0.9 % (ref 0.0–3.0)
Eosinophils Absolute: 0.5 10*3/uL (ref 0.0–0.7)
Eosinophils Relative: 7.2 % — ABNORMAL HIGH (ref 0.0–5.0)
HCT: 38.5 % (ref 36.0–46.0)
Hemoglobin: 12.8 g/dL (ref 12.0–15.0)
Lymphocytes Relative: 23.6 % (ref 12.0–46.0)
Lymphs Abs: 1.6 10*3/uL (ref 0.7–4.0)
MCHC: 33.1 g/dL (ref 30.0–36.0)
MCV: 101.2 fL — ABNORMAL HIGH (ref 78.0–100.0)
Monocytes Absolute: 0.3 10*3/uL (ref 0.1–1.0)
Monocytes Relative: 4.3 % (ref 3.0–12.0)
Neutro Abs: 4.5 10*3/uL (ref 1.4–7.7)
Neutrophils Relative %: 64 % (ref 43.0–77.0)
Platelets: 340 10*3/uL (ref 150.0–400.0)
RBC: 3.8 Mil/uL — ABNORMAL LOW (ref 3.87–5.11)
RDW: 14.9 % (ref 11.5–15.5)
WBC: 7 10*3/uL (ref 4.0–10.5)

## 2023-11-18 LAB — HEMOGLOBIN A1C: Hgb A1c MFr Bld: 5.5 % (ref 4.6–6.5)

## 2023-11-18 LAB — VITAMIN B12: Vitamin B-12: 311 pg/mL (ref 211–911)

## 2023-11-18 NOTE — Progress Notes (Signed)
Hemoglobin is stable Eosinophils are elevated so something allergic going on. Keep taking the zyrtec B12 on low side-increase by 1000 mcg/d-ideally take the sublingual or dissolvable or can do shots

## 2023-11-23 ENCOUNTER — Encounter: Payer: Self-pay | Admitting: *Deleted

## 2023-12-16 ENCOUNTER — Telehealth (HOSPITAL_BASED_OUTPATIENT_CLINIC_OR_DEPARTMENT_OTHER): Payer: Commercial Managed Care - PPO | Admitting: Obstetrics & Gynecology

## 2023-12-16 ENCOUNTER — Other Ambulatory Visit (HOSPITAL_BASED_OUTPATIENT_CLINIC_OR_DEPARTMENT_OTHER): Payer: Self-pay | Admitting: Obstetrics & Gynecology

## 2023-12-16 DIAGNOSIS — N920 Excessive and frequent menstruation with regular cycle: Secondary | ICD-10-CM

## 2023-12-16 DIAGNOSIS — D251 Intramural leiomyoma of uterus: Secondary | ICD-10-CM

## 2023-12-19 ENCOUNTER — Encounter (HOSPITAL_BASED_OUTPATIENT_CLINIC_OR_DEPARTMENT_OTHER): Payer: Self-pay | Admitting: Obstetrics & Gynecology

## 2023-12-19 NOTE — Progress Notes (Signed)
Virtual Visit via Video Note  I connected with Stacey Moore on 12/19/23 at  4:15 PM EST by a video enabled telemedicine application and verified that I am speaking with the correct person using two identifiers.  Location: Patient: home Provider: office   I discussed the limitations of evaluation and management by telemedicine and the availability of in person appointments. The patient expressed understanding and agreed to proceed.  History of Present Illness: 42 yo G0 with fibroid uterus for discussion of treatment.  She has decided that she wants to proceed with hysterectomy.  We had previously discussed depo lupron to try and shrink lesion before surgery.  She would prefer minimally invasive technique so will need to try something to make fibroids smaller.  Depo lupron or [place] are options.  She is willing to try Depo lupron for one month.  Add back therapy recommended.  If does well, could use 90 day injection and would recommend using for 6 months and scheduling surgery at that time.  She is very sure about no future child bearing.    Of note, she did have a CBC with diff 11/17/2023 and hb was 12.8.     Observations/Objective: WNWD WF, NAD  Assessment and Plan: 1. Intramural uterine fibroid (Primary) - will proceed with starting depo lupron.  Authorization has already been completed by nursing staff so hopefully can get started soon.  Will use aygestin 5mg  daily with tihs  2. Menorrhagia with regular cycle - most recent hb was normal   Follow Up Instructions:    I discussed the assessment and treatment plan with the patient. The patient was provided an opportunity to ask questions and all were answered. The patient agreed with the plan and demonstrated an understanding of the instructions.   The patient was advised to call back or seek an in-person evaluation if the symptoms worsen or if the condition fails to improve as anticipated.  I provided 23 minutes of non-face-to-face  time during this encounter.   Jerene Bears, MD

## 2024-01-06 ENCOUNTER — Telehealth (HOSPITAL_BASED_OUTPATIENT_CLINIC_OR_DEPARTMENT_OTHER): Payer: Self-pay | Admitting: *Deleted

## 2024-01-06 NOTE — Telephone Encounter (Signed)
 LMOVM that new Berkley Harvey has been requested on Depo Lupron with insurance since last has expired. Advised that I would let her know once approval has been obtained.

## 2024-01-27 ENCOUNTER — Telehealth (HOSPITAL_BASED_OUTPATIENT_CLINIC_OR_DEPARTMENT_OTHER): Payer: Self-pay

## 2024-01-27 NOTE — Telephone Encounter (Signed)
Entered in Error

## 2024-01-28 ENCOUNTER — Telehealth (HOSPITAL_BASED_OUTPATIENT_CLINIC_OR_DEPARTMENT_OTHER): Payer: Self-pay | Admitting: *Deleted

## 2024-01-28 NOTE — Telephone Encounter (Signed)
DPR reviewed. LMOVM that out of pocket cost for Lupron would be at least $2000 as that is what her deductible is. Advised that there may be a discount card through [employer] to help lower the cost.

## 2024-02-01 ENCOUNTER — Telehealth (HOSPITAL_BASED_OUTPATIENT_CLINIC_OR_DEPARTMENT_OTHER): Payer: Self-pay | Admitting: *Deleted

## 2024-02-01 NOTE — Telephone Encounter (Signed)
LMOVM for pt to call office regarding Lupron

## 2024-02-02 ENCOUNTER — Encounter (HOSPITAL_BASED_OUTPATIENT_CLINIC_OR_DEPARTMENT_OTHER): Payer: Self-pay | Admitting: *Deleted

## 2024-02-08 ENCOUNTER — Other Ambulatory Visit (HOSPITAL_BASED_OUTPATIENT_CLINIC_OR_DEPARTMENT_OTHER): Payer: Self-pay

## 2024-02-08 ENCOUNTER — Other Ambulatory Visit (HOSPITAL_BASED_OUTPATIENT_CLINIC_OR_DEPARTMENT_OTHER): Payer: Self-pay | Admitting: *Deleted

## 2024-02-08 ENCOUNTER — Other Ambulatory Visit (HOSPITAL_COMMUNITY): Payer: Self-pay

## 2024-02-08 DIAGNOSIS — D251 Intramural leiomyoma of uterus: Secondary | ICD-10-CM

## 2024-02-08 MED ORDER — LEUPROLIDE ACETATE 3.75 MG IM KIT
3.7500 mg | PACK | INTRAMUSCULAR | 5 refills | Status: DC
  Filled 2024-02-08: qty 1, 28d supply, fill #0
  Filled 2024-03-16: qty 1, 28d supply, fill #1
  Filled 2024-04-21: qty 1, 28d supply, fill #2
  Filled 2024-05-18 – 2024-05-19 (×2): qty 1, 28d supply, fill #3
  Filled 2024-06-14 (×2): qty 1, 28d supply, fill #4
  Filled 2024-07-11: qty 1, 28d supply, fill #5

## 2024-02-08 NOTE — Progress Notes (Signed)
 Pt reports that [facility] advised that her prescription for Lupron  should be sent to [facility]. Rx sent. LMOVM that once she picks up prescription, she should bring to office for administration.  Advised pt to call and let us  know when she picks up the medication.

## 2024-02-09 ENCOUNTER — Other Ambulatory Visit (HOSPITAL_COMMUNITY): Payer: Self-pay

## 2024-02-10 ENCOUNTER — Encounter (HOSPITAL_COMMUNITY): Payer: Self-pay | Admitting: Pharmacist

## 2024-02-10 ENCOUNTER — Encounter (HOSPITAL_BASED_OUTPATIENT_CLINIC_OR_DEPARTMENT_OTHER): Payer: Self-pay | Admitting: *Deleted

## 2024-02-10 ENCOUNTER — Other Ambulatory Visit (HOSPITAL_COMMUNITY): Payer: Self-pay

## 2024-02-11 ENCOUNTER — Other Ambulatory Visit (HOSPITAL_COMMUNITY): Payer: Self-pay

## 2024-02-11 ENCOUNTER — Other Ambulatory Visit: Payer: Self-pay

## 2024-02-13 ENCOUNTER — Other Ambulatory Visit (HOSPITAL_COMMUNITY): Payer: Self-pay

## 2024-02-15 ENCOUNTER — Other Ambulatory Visit (HOSPITAL_COMMUNITY): Payer: Self-pay

## 2024-02-15 MED ORDER — VALACYCLOVIR HCL 1 G PO TABS
2000.0000 mg | ORAL_TABLET | ORAL | 1 refills | Status: AC
Start: 2023-06-03 — End: ?
  Filled 2024-02-15: qty 30, 30d supply, fill #0

## 2024-02-19 ENCOUNTER — Other Ambulatory Visit (HOSPITAL_BASED_OUTPATIENT_CLINIC_OR_DEPARTMENT_OTHER): Payer: Self-pay | Admitting: Obstetrics & Gynecology

## 2024-02-19 ENCOUNTER — Ambulatory Visit (INDEPENDENT_AMBULATORY_CARE_PROVIDER_SITE_OTHER): Payer: 59

## 2024-02-19 ENCOUNTER — Other Ambulatory Visit (HOSPITAL_BASED_OUTPATIENT_CLINIC_OR_DEPARTMENT_OTHER): Payer: Self-pay

## 2024-02-19 VITALS — BP 120/80 | HR 71 | Ht 64.0 in | Wt 157.6 lb

## 2024-02-19 DIAGNOSIS — D251 Intramural leiomyoma of uterus: Secondary | ICD-10-CM

## 2024-02-19 DIAGNOSIS — R232 Flushing: Secondary | ICD-10-CM

## 2024-02-19 MED ORDER — LEUPROLIDE ACETATE 3.75 MG IM KIT
3.7500 mg | PACK | Freq: Once | INTRAMUSCULAR | Status: AC
  Administered 2024-02-19: 3.75 mg via INTRAMUSCULAR

## 2024-02-19 MED ORDER — NORETHINDRONE ACETATE 5 MG PO TABS
5.0000 mg | ORAL_TABLET | Freq: Every day | ORAL | 1 refills | Status: DC
  Filled 2024-02-19: qty 90, 90d supply, fill #0

## 2024-02-19 NOTE — Progress Notes (Deleted)
 Pt presented office for lupron injection. Pt tolerated injection well. Pt will return in 28 days for next injection.

## 2024-03-16 ENCOUNTER — Other Ambulatory Visit: Payer: Self-pay

## 2024-03-16 ENCOUNTER — Other Ambulatory Visit (HOSPITAL_COMMUNITY): Payer: Self-pay

## 2024-03-17 ENCOUNTER — Other Ambulatory Visit: Payer: Self-pay

## 2024-03-17 ENCOUNTER — Other Ambulatory Visit (HOSPITAL_COMMUNITY): Payer: Self-pay

## 2024-03-19 ENCOUNTER — Other Ambulatory Visit (HOSPITAL_COMMUNITY): Payer: Self-pay

## 2024-04-21 ENCOUNTER — Other Ambulatory Visit (HOSPITAL_COMMUNITY): Payer: Self-pay

## 2024-04-22 ENCOUNTER — Ambulatory Visit (HOSPITAL_BASED_OUTPATIENT_CLINIC_OR_DEPARTMENT_OTHER)

## 2024-04-22 ENCOUNTER — Other Ambulatory Visit (HOSPITAL_COMMUNITY): Payer: Self-pay

## 2024-04-23 ENCOUNTER — Other Ambulatory Visit (HOSPITAL_COMMUNITY): Payer: Self-pay

## 2024-05-10 ENCOUNTER — Ambulatory Visit (INDEPENDENT_AMBULATORY_CARE_PROVIDER_SITE_OTHER): Admitting: Family Medicine

## 2024-05-10 ENCOUNTER — Encounter: Payer: Self-pay | Admitting: Family Medicine

## 2024-05-10 ENCOUNTER — Other Ambulatory Visit (HOSPITAL_COMMUNITY): Payer: Self-pay

## 2024-05-10 VITALS — BP 126/86 | HR 68 | Temp 98.2°F | Resp 16 | Ht 64.0 in | Wt 153.0 lb

## 2024-05-10 DIAGNOSIS — R21 Rash and other nonspecific skin eruption: Secondary | ICD-10-CM | POA: Diagnosis not present

## 2024-05-10 MED ORDER — PREDNISONE 20 MG PO TABS
40.0000 mg | ORAL_TABLET | Freq: Every day | ORAL | 0 refills | Status: AC
Start: 2024-05-10 — End: 2024-05-23
  Filled 2024-05-10: qty 10, 5d supply, fill #0

## 2024-05-10 MED ORDER — ACYCLOVIR 400 MG PO TABS
400.0000 mg | ORAL_TABLET | Freq: Three times a day (TID) | ORAL | 0 refills | Status: AC
  Filled 2024-05-10: qty 21, 7d supply, fill #0

## 2024-05-10 MED ORDER — TRIAMCINOLONE ACETONIDE 0.1 % EX CREA
1.0000 | TOPICAL_CREAM | Freq: Two times a day (BID) | CUTANEOUS | 0 refills | Status: DC
  Filled 2024-05-10: qty 30, 15d supply, fill #0

## 2024-05-10 NOTE — Patient Instructions (Signed)
 It was very nice to see you today!  Pityriasis rosea   PLEASE NOTE:  If you had any lab tests please let us  know if you have not heard back within a few days. You may see your results on MyChart before we have a chance to review them but we will give you a call once they are reviewed by us . If we ordered any referrals today, please let us  know if you have not heard from their office within the next week.   Please try these tips to maintain a healthy lifestyle:  Eat most of your calories during the day when you are active. Eliminate processed foods including packaged sweets (pies, cakes, cookies), reduce intake of potatoes, white bread, white pasta, and white rice. Look for whole grain options, oat flour or almond flour.  Each meal should contain half fruits/vegetables, one quarter protein, and one quarter carbs (no bigger than a computer mouse).  Cut down on sweet beverages. This includes juice, soda, and sweet tea. Also watch fruit intake, though this is a healthier sweet option, it still contains natural sugar! Limit to 3 servings daily.  Drink at least 1 glass of water with each meal and aim for at least 8 glasses per day  Exercise at least 150 minutes every week.

## 2024-05-10 NOTE — Progress Notes (Signed)
 Subjective:     Patient ID: Carmin Chow, female    DOB: [birth year], 43 y.o.   MRN: [id number]  Chief Complaint  Patient presents with   Rash    Rash on chest and abdomen x 1 week, no pain, just itching     HPI Discussed the use of AI scribe software for clinical note transcription with the patient, who gave verbal consent to proceed.  History of Present Illness [place] A Lesner is a 43 year old female who presents with a widespread rash.  She has been experiencing a rash for over a week, which initially appeared as a patch under her breast and has progressively worsened, spreading to her neck and hip. The rash is characterized by itchiness and a burning sensation. No pain, swelling in her throat, or shortness of breath. She has a history of hives but describes this rash as different.  She has been on Depo Lupron  for three months, which is the only new medication. She occasionally takes valacyclovir , about once every couple of months, and had taken it within the past few weeks, but notes the rash started before her recent dose. She has not been taking Adjustin but keeps it on an as-needed basis. Her current medications include vitamins, omeprazole, and infrequent use of valacyclovir . She also uses hydroxyzine  and Benadryl , which make her tired, and occasionally Zyrtec or Claritin  for relief.  She has not yet seen an allergist due to insurance issues. She previously visited a dermatologist a couple of years ago.  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    There are no preventive care reminders to display for this patient.   Past Medical History:  Diagnosis Date   Allergy    Fibroid    GERD (gastroesophageal reflux disease)     Past Surgical History:  Procedure Laterality Date   LIPOMA EXCISION N/A 04/25/2016   Procedure: EXCISION LIPOMA; back  Surgeon: Shela Derby, MD;  Location: [facility] OR;  Service: General;  Laterality: N/A;   WISDOM TOOTH  EXTRACTION       Current Outpatient Medications:    acyclovir (ZOVIRAX) 400 MG tablet, Take 1 tablet (400 mg total) by mouth 3 (three) times daily for 7 days., Disp: 21 tablet, Rfl: 0   Ascorbic Acid (VITAMIN C) 100 MG CHEW, Chew 0.5 tablets by mouth daily., Disp: , Rfl:    cyanocobalamin  (VITAMIN B12) 1000 MCG tablet, Take by mouth., Disp: , Rfl:    ferrous sulfate 324 MG TBEC, Take 324 mg by mouth 1 day or 1 dose., Disp: , Rfl:    hydrOXYzine  (ATARAX ) 25 MG tablet, Take 1 tablet (25 mg total) by mouth every 6 (six) hours as needed., Disp: 12 tablet, Rfl: 0   leuprolide  (LUPRON ) 3.75 MG injection, Inject 3.75 mg into the muscle every 28 (twenty-eight) days., Disp: 1 kit, Rfl: 5   nystatin cream (MYCOSTATIN), Apply topically 2 (two) times daily., Disp: , Rfl:    omeprazole (PRILOSEC) 20 MG capsule, Take 20 mg by mouth daily., Disp: , Rfl:    predniSONE  (DELTASONE ) 20 MG tablet, Take 2 tablets (40 mg total) by mouth daily with breakfast for 5 days., Disp: 10 tablet, Rfl: 0   triamcinolone cream (KENALOG) 0.1 %, Apply 1 Application topically 2 (two) times daily., Disp: 30 g, Rfl: 0   valACYclovir  (VALTREX ) 1000 MG tablet, Take 2 tablets (2,000 mg total) by mouth onset and repeat in 12 hours, Disp: 30 tablet, Rfl: 1  norethindrone  (AYGESTIN ) 5 MG tablet, Take 1 tablet (5 mg total) by mouth daily. (Patient not taking: Reported on 05/10/2024), Disp: 90 tablet, Rfl: 1  No Known Allergies ROS neg/noncontributory except as noted HPI/below      Objective:      BP 126/86   Pulse 68   Temp 98.2 F (36.8 C) (Temporal)   Resp 16   Ht 5\' 4"  (1.626 m)   Wt 153 lb (69.4 kg)   SpO2 99%   BMI 26.26 kg/m  Wt Readings from Last 3 Encounters:  05/10/24 153 lb (69.4 kg)  02/19/24 157 lb 9.6 oz (71.5 kg)  11/17/23 156 lb 8 oz (71 kg)    Physical Exam   Gen: WDWN NAD HEENT: NCAT, conjunctiva not injected, sclera nonicteric EXT:  no edema MSK: no gross abnormalities.  NEURO: A&O x3.  CN  II-XII intact.  PSYCH: normal mood. Good eye contact  Skin:  diffuse red/pink rash w/patches scattered.  ?herald patch under R breast.     Assessment & Plan:  Rash  Other orders -     Acyclovir; Take 1 tablet (400 mg total) by mouth 3 (three) times daily for 7 days.  Dispense: 21 tablet; Refill: 0 -     Triamcinolone Acetonide; Apply 1 Application topically 2 (two) times daily.  Dispense: 30 g; Refill: 0 -     predniSONE ; Take 2 tablets (40 mg total) by mouth daily with breakfast for 5 days.  Dispense: 10 tablet; Refill: 0  Assessment and Plan Assessment & Plan Rash-suspect Pityriasis   The rash has been present for over a week, starting under the breast and spreading. It is itchy with a burning sensation but not painful. The differential diagnosis includes psoriasis and pityriasis rosea, with pityriasis suspected due to the rash's pattern and presentation. It does not appear to be a drug reaction or shingles. But drug rxn still in the DD.  The rash may last about three months, and treatment is primarily supportive. Sunlight exposure may help. Antihistamines like hydroxyzine , Benadryl , Zyrtec, and Claritin  can provide symptomatic relief. Topical steroids are impractical due to the rash's widespread nature but can use on bad spots. Acyclovir is considered, though its efficacy is uncertain. Prednisone  is offered to see if it helps with symptoms. Prescribe Acyclovir 400 mg three times daily for seven days and prednisone  for five days. Continue using oral antihistamines such as Zyrtec and Claritin  for relief. Prescribe triamcinolone cream for localized severe itching. Consider sunlight exposure as a supportive measure.  Uterine fibroid   She is on Depo Lupron  for uterine fibroid management and is scheduled for a follow-up with her gynecologist to evaluate the fibroid size. The goal is to reduce the fibroid to less than ten centimeters to allow for a laparoscopic hysterectomy. She has decided on a  hysterectomy for fibroid and uterus removal. Continue Depo Lupron  treatment and follow up with the gynecologist on May 27 or 28 for fibroid size evaluation.    Return if symptoms worsen or fail to improve.  Ellsworth Haas, MD

## 2024-05-16 ENCOUNTER — Encounter: Payer: Commercial Managed Care - PPO | Admitting: Family Medicine

## 2024-05-18 ENCOUNTER — Other Ambulatory Visit (HOSPITAL_COMMUNITY): Payer: Self-pay

## 2024-05-19 ENCOUNTER — Other Ambulatory Visit (HOSPITAL_COMMUNITY): Payer: Self-pay

## 2024-05-20 ENCOUNTER — Other Ambulatory Visit (HOSPITAL_COMMUNITY): Payer: Self-pay

## 2024-05-24 ENCOUNTER — Ambulatory Visit (INDEPENDENT_AMBULATORY_CARE_PROVIDER_SITE_OTHER): Admitting: Obstetrics & Gynecology

## 2024-05-24 ENCOUNTER — Other Ambulatory Visit (HOSPITAL_COMMUNITY)
Admission: RE | Admit: 2024-05-24 | Discharge: 2024-05-24 | Disposition: A | Discharge status: Home / Self Care | Source: Ambulatory Visit | Attending: Obstetrics & Gynecology | Admitting: Obstetrics & Gynecology

## 2024-05-24 VITALS — BP 121/87 | HR 81 | Ht 64.0 in | Wt 156.6 lb

## 2024-05-24 DIAGNOSIS — Z124 Encounter for screening for malignant neoplasm of cervix: Secondary | ICD-10-CM | POA: Diagnosis not present

## 2024-05-24 DIAGNOSIS — N920 Excessive and frequent menstruation with regular cycle: Secondary | ICD-10-CM | POA: Diagnosis not present

## 2024-05-24 DIAGNOSIS — D251 Intramural leiomyoma of uterus: Secondary | ICD-10-CM

## 2024-05-24 DIAGNOSIS — Z720 Tobacco use: Secondary | ICD-10-CM | POA: Diagnosis not present

## 2024-05-24 DIAGNOSIS — Z862 Personal history of diseases of the blood and blood-forming organs and certain disorders involving the immune mechanism: Secondary | ICD-10-CM

## 2024-05-27 ENCOUNTER — Encounter (HOSPITAL_BASED_OUTPATIENT_CLINIC_OR_DEPARTMENT_OTHER): Payer: Self-pay | Admitting: Obstetrics & Gynecology

## 2024-05-27 ENCOUNTER — Ambulatory Visit (HOSPITAL_BASED_OUTPATIENT_CLINIC_OR_DEPARTMENT_OTHER): Payer: Self-pay | Admitting: Obstetrics & Gynecology

## 2024-05-27 LAB — CYTOLOGY - PAP
Comment: NEGATIVE
Diagnosis: NEGATIVE
High risk HPV: NEGATIVE

## 2024-05-27 NOTE — Progress Notes (Signed)
 GYNECOLOGY  VISIT  CC:   recheck after Depo Lupron  use x 3 months  HPI: 43 y.o. G0P0000 Single Unavailable female here for recheck.  Has been on Depo Lupron .  After starting, has really not had any bleeding.  Feels uterine fibroid is smaller.  Less fullness in abdomen and pelvis.  Really ready to proceed with surgical treatment.  Discussed possibility of having myomectomy.  She is very comfortable with definitive surgery.  Is 42 and concerned will end up needing another surgery if does more conservative measure.  Discussed timing of surgery so will proceed with scheduling at this point.  Feel she can let HR know as well for planning.  Discussed time out of work as well.  .   Past Medical History:  Diagnosis Date   Allergy    Fibroid    GERD (gastroesophageal reflux disease)     MEDS:   Current Outpatient Medications on File Prior to Visit  Medication Sig Dispense Refill   Ascorbic Acid (VITAMIN C) 100 MG CHEW Chew 0.5 tablets by mouth daily.     cyanocobalamin  (VITAMIN B12) 1000 MCG tablet Take by mouth.     ferrous sulfate 324 MG TBEC Take 324 mg by mouth 1 day or 1 dose.     hydrOXYzine  (ATARAX ) 25 MG tablet Take 1 tablet (25 mg total) by mouth every 6 (six) hours as needed. 12 tablet 0   leuprolide  (LUPRON ) 3.75 MG injection Inject 3.75 mg into the muscle every 28 (twenty-eight) days. 1 kit 5   norethindrone  (AYGESTIN ) 5 MG tablet Take 1 tablet (5 mg total) by mouth daily. (Patient not taking: Reported on 05/10/2024) 90 tablet 1   nystatin cream (MYCOSTATIN) Apply topically 2 (two) times daily.     omeprazole (PRILOSEC) 20 MG capsule Take 20 mg by mouth daily.     triamcinolone  cream (KENALOG ) 0.1 % Apply topically 2 (two) times daily. 30 g 0   valACYclovir  (VALTREX ) 1000 MG tablet Take 2 tablets (2,000 mg total) by mouth onset and repeat in 12 hours 30 tablet 1   No current facility-administered medications on file prior to visit.    ALLERGIES: Patient has no known  allergies.  SH:  single, smoker  Review of Systems  Constitutional: Negative.   Genitourinary: Negative.     PHYSICAL EXAMINATION:    BP 121/87 (BP Location: Right Arm, Patient Position: Sitting, Cuff Size: Normal)   Pulse 81   Ht 5\' 4"  (1.626 m)   Wt 156 lb 9.6 oz (71 kg)   BMI 26.88 kg/m     General appearance: alert, cooperative and appears stated age Abdomen: soft, non-tender; bowel sounds normal; pelvic mass still palpable around 16 weeks size,  no upper abdominal organomegaly Lymph:  no inguinal LAD noted  Pelvic: External genitalia:  no lesions              Urethra:  normal appearing urethra with no masses, tenderness or lesions              Bartholins and Skenes: normal                 Vagina: normal mucosa without prolapse or lesions              Cervix: no lesions              Bimanual Exam:  Uterus:  enlarged, 16 weeks size, but mobile and smooth  Adnexa: no mass, fullness, tenderness              Anus:  no lesions  Chaperone, Myrtie Atkinson, CMA, was present for exam.  Assessment/Plan: 1. Intramural uterine fibroid (Primary) - will proceed with surgery scheduling - continue monthly depo lupron  for completion of 6 months - Ambulatory Referral For Surgery Scheduling  2. Cervical cancer screening - Cytology - PAP( [place])  3. Menorrhagia with regular cycle  4. Tobacco use  5.  History of anemia - improved with no bleeding on Depo Lupron   Total time with pt:  24 minutes Documentation time:  8 minutes Total time:  32 minutes

## 2024-06-02 ENCOUNTER — Encounter (HOSPITAL_BASED_OUTPATIENT_CLINIC_OR_DEPARTMENT_OTHER): Payer: Self-pay | Admitting: Obstetrics & Gynecology

## 2024-06-02 ENCOUNTER — Telehealth: Payer: Self-pay

## 2024-06-02 NOTE — Telephone Encounter (Signed)
 I called patient to see if she was available for surgery w/ Dr. Annabell Key on 08/23/24. I am unable to leave a voicemail due to patient mailbox being full.

## 2024-06-14 ENCOUNTER — Other Ambulatory Visit (HOSPITAL_COMMUNITY): Payer: Self-pay

## 2024-06-15 ENCOUNTER — Other Ambulatory Visit: Payer: Self-pay

## 2024-06-20 ENCOUNTER — Telehealth: Payer: Self-pay

## 2024-06-20 NOTE — Telephone Encounter (Signed)
 Patient called me back to secure surgery w/ Dr. Cleotilde on 08/23/24 at 10:30 am/[facility]. I provided pre-op instructions and surgery details by phone. Patient confirmed understanding.

## 2024-06-30 ENCOUNTER — Other Ambulatory Visit (HOSPITAL_BASED_OUTPATIENT_CLINIC_OR_DEPARTMENT_OTHER): Payer: Self-pay | Admitting: Obstetrics & Gynecology

## 2024-06-30 DIAGNOSIS — D251 Intramural leiomyoma of uterus: Secondary | ICD-10-CM

## 2024-07-06 ENCOUNTER — Encounter (HOSPITAL_BASED_OUTPATIENT_CLINIC_OR_DEPARTMENT_OTHER): Payer: Self-pay

## 2024-07-11 ENCOUNTER — Other Ambulatory Visit (HOSPITAL_COMMUNITY): Payer: Self-pay

## 2024-07-12 ENCOUNTER — Other Ambulatory Visit (HOSPITAL_COMMUNITY): Payer: Self-pay

## 2024-07-15 ENCOUNTER — Telehealth (HOSPITAL_BASED_OUTPATIENT_CLINIC_OR_DEPARTMENT_OTHER): Payer: Self-pay | Admitting: Obstetrics & Gynecology

## 2024-07-15 NOTE — Telephone Encounter (Signed)
 New message    Mom at the front desk with FMLA paperwork to be completed .   Daughter surgery 08/23/24.

## 2024-07-16 ENCOUNTER — Other Ambulatory Visit (HOSPITAL_COMMUNITY): Payer: Self-pay

## 2024-07-19 ENCOUNTER — Other Ambulatory Visit (HOSPITAL_BASED_OUTPATIENT_CLINIC_OR_DEPARTMENT_OTHER): Payer: Self-pay | Admitting: *Deleted

## 2024-07-19 ENCOUNTER — Other Ambulatory Visit (HOSPITAL_COMMUNITY): Payer: Self-pay

## 2024-07-19 DIAGNOSIS — D251 Intramural leiomyoma of uterus: Secondary | ICD-10-CM

## 2024-07-19 MED ORDER — LEUPROLIDE ACETATE 3.75 MG IM KIT
3.7500 mg | PACK | INTRAMUSCULAR | 0 refills | Status: DC
  Filled 2024-07-19 – 2024-08-10 (×3): qty 1, 28d supply, fill #0

## 2024-07-19 NOTE — Progress Notes (Signed)
 Per Dr Cleotilde pt is to have last dose of Lupron  8/17.  Rx per previous note that the RX goes to [facility].  Sent to [facility]

## 2024-07-20 ENCOUNTER — Other Ambulatory Visit (HOSPITAL_COMMUNITY): Payer: Self-pay

## 2024-07-22 ENCOUNTER — Telehealth: Payer: Self-pay | Admitting: Family Medicine

## 2024-07-22 NOTE — Telephone Encounter (Unsigned)
 Copied from CRM [id number]. Topic: General - Other >> Jul 22, 2024 11:20 AM Willma SAUNDERS wrote: Reason for CRM: patients mother barnie states she dropped paperwork off at the office to be filled out for her to get FMLA to take care of her daughter after surgery. Is requesting a status update.  barnie can be reached at [phone]

## 2024-07-22 NOTE — Telephone Encounter (Signed)
**Note De-identified  [place] Obfuscation** Please advise 

## 2024-08-03 ENCOUNTER — Encounter (HOSPITAL_BASED_OUTPATIENT_CLINIC_OR_DEPARTMENT_OTHER): Payer: Self-pay | Admitting: Obstetrics & Gynecology

## 2024-08-05 ENCOUNTER — Encounter (HOSPITAL_BASED_OUTPATIENT_CLINIC_OR_DEPARTMENT_OTHER): Payer: Self-pay

## 2024-08-05 ENCOUNTER — Telehealth (HOSPITAL_BASED_OUTPATIENT_CLINIC_OR_DEPARTMENT_OTHER): Payer: Self-pay

## 2024-08-05 NOTE — Telephone Encounter (Signed)
 Left message for patient at number provided [phone] requesting a return call to schedule a pre-op appointment with Dr.Miller prior to surgery on 08/23/24. Advised we have received her FMLA paperwork as well as her mothers. These have been completed and will be sent today.

## 2024-08-05 NOTE — Telephone Encounter (Signed)
 FMLA forms completed with explanation that you will need a pre op, 1 week and 6 week post op appointment. I have faxed your FMLA paperwork successfully with confirmation to [phone]. Mychart message sent to patient advising of FMLA form completion and requesting a call to the office to schedule pre op and post op appointments.

## 2024-08-08 ENCOUNTER — Encounter (HOSPITAL_BASED_OUTPATIENT_CLINIC_OR_DEPARTMENT_OTHER): Payer: Self-pay

## 2024-08-09 ENCOUNTER — Encounter: Admitting: Family Medicine

## 2024-08-09 NOTE — Telephone Encounter (Signed)
 Reviewed patients appointment desk. Char was able to schedule patient for pre op on 08/16/24 at 8:15 am with Dr.Miller. 1 week post op on 09/01/24 at 1:55 pm with Dr.Miller. 6 week post op with Dr.Miller on 09/22/24 at 8:55 am.

## 2024-08-10 ENCOUNTER — Other Ambulatory Visit (HOSPITAL_COMMUNITY): Payer: Self-pay

## 2024-08-11 ENCOUNTER — Other Ambulatory Visit (HOSPITAL_COMMUNITY): Payer: Self-pay

## 2024-08-16 ENCOUNTER — Ambulatory Visit (HOSPITAL_BASED_OUTPATIENT_CLINIC_OR_DEPARTMENT_OTHER): Admitting: Obstetrics & Gynecology

## 2024-08-16 ENCOUNTER — Other Ambulatory Visit (HOSPITAL_BASED_OUTPATIENT_CLINIC_OR_DEPARTMENT_OTHER): Payer: Self-pay | Admitting: Obstetrics & Gynecology

## 2024-08-16 ENCOUNTER — Encounter (HOSPITAL_BASED_OUTPATIENT_CLINIC_OR_DEPARTMENT_OTHER): Payer: Self-pay | Admitting: Obstetrics & Gynecology

## 2024-08-16 VITALS — BP 138/90 | HR 77 | Ht 64.0 in | Wt 160.0 lb

## 2024-08-16 DIAGNOSIS — K219 Gastro-esophageal reflux disease without esophagitis: Secondary | ICD-10-CM

## 2024-08-16 DIAGNOSIS — Z1231 Encounter for screening mammogram for malignant neoplasm of breast: Secondary | ICD-10-CM

## 2024-08-16 DIAGNOSIS — D251 Intramural leiomyoma of uterus: Secondary | ICD-10-CM

## 2024-08-16 DIAGNOSIS — Z720 Tobacco use: Secondary | ICD-10-CM

## 2024-08-16 DIAGNOSIS — N920 Excessive and frequent menstruation with regular cycle: Secondary | ICD-10-CM

## 2024-08-16 DIAGNOSIS — Z862 Personal history of diseases of the blood and blood-forming organs and certain disorders involving the immune mechanism: Secondary | ICD-10-CM

## 2024-08-19 ENCOUNTER — Encounter (HOSPITAL_COMMUNITY): Payer: Self-pay | Admitting: Obstetrics & Gynecology

## 2024-08-19 ENCOUNTER — Encounter (HOSPITAL_BASED_OUTPATIENT_CLINIC_OR_DEPARTMENT_OTHER): Payer: Self-pay

## 2024-08-19 NOTE — Pre-Procedure Instructions (Signed)
 Surgical Instructions   Your procedure is scheduled on :  Tuesday,  08-23-2024. Report to [facility] Main Entrance A at 9:00  A.M., then check in with the Admitting office. Any questions or running late day of surgery: call [phone]  Questions prior to your surgery date: call [phone], Monday-Friday, 8am-4pm. If you experience any cold or flu symptoms such as cough, fever, chills, shortness of breath, etc. between now and your scheduled surgery, please notify your surgeon office.    Remember:  Do not eat any food after midnight the night before your surgery.  You may have clear liquids from midnight night before surgery until 8:00 AM.   Clear liquids allowed are:  Water Carbonated Beverages Clear Tea (no milk, honey, etc.) Black Coffee Only (NO MILK, CREAM OR POWDERED CREAMER of any kind) Sport drinks, like Gatorade.  NO clear liquids after 8:00 AM day of surgery.  This includes no water,  candy,  gum,  and  mints.    Take these medicines the morning of surgery with A SIPS OF WATER : Omeprazole (prilosec)   May take these medicines IF NEEDED: Cetirizine (zyrtec) Hydroxyzine  (atarax )   One week prior to surgery, STOP taking any Aspirin (unless otherwise instructed by your surgeon) Aleve, Naproxen, Ibuprofen, Motrin, Advil, Goody's, BC's, all herbal medications, fish oil, and non-prescription vitamins.                     Do NOT Smoke (Tobacco/Vaping) and Do Not drink alcohol for 24 hours prior to your procedure.  If you use a CPAP at night, you may bring your mask/headgear for your overnight stay.   You will be asked to remove any contacts, glasses, piercing's, hearing aid's, dentures/partials prior to surgery. Please bring cases for these items if needed.    Patients discharged the day of surgery will not be allowed to drive home, and someone needs to stay with them for 24 hours.  SURGICAL WAITING ROOM VISITATION Patients may have no more than 2 support people  in the waiting area - these visitors may rotate.   Pre-op nurse will coordinate an appropriate time for 1 ADULT support person, who may not rotate, to accompany patient in pre-op.  Children under the age of 23 must have an adult with them who is not the patient and must remain in the main waiting area with an adult.  If the patient needs to stay at the hospital during part of their recovery, the visitor guidelines for inpatient rooms apply.  Please refer to the [facility] website for the visitor guidelines for any additional information.   If you received a COVID test during your pre-op visit  it is requested that you wear a mask when out in public, stay away from anyone that may not be feeling well and notify your surgeon if you develop symptoms. If you have been in contact with anyone that has tested positive in the last 10 days please notify you surgeon.      Pre-operative CHG Bathing Instructions   You can play a key role in reducing the risk of infection after surgery. Your skin needs to be as free of germs as possible. You can reduce the number of germs on your skin by washing with CHG (chlorhexidine  gluconate) soap before surgery. CHG is an antiseptic soap that kills germs and continues to kill germs even after washing.   DO NOT use if you have an allergy to chlorhexidine /CHG or antibacterial soaps. If your  skin becomes reddened or irritated, stop using the CHG and notify one of our RN day of surgery.             TAKE A SHOWER THE NIGHT BEFORE SURGERY AND THE DAY OF SURGERY    Please keep in mind the following:  DO NOT shave, including legs and genital area, 48 hours prior to surgery.   You may shave your face before/day of surgery.  Place clean sheets on your bed the night before surgery Use a clean washcloth (not used since being washed) for each shower. DO NOT sleep with pet's night before surgery.  CHG Shower Instructions:  Wash your face and private area with normal soap.  If you choose to wash your hair, wash first with your normal shampoo.  After you use shampoo/soap, rinse your hair and body thoroughly to remove shampoo/soap residue.  Turn the water OFF and apply half the bottle of CHG soap to a CLEAN washcloth.  Apply CHG soap ONLY FROM YOUR NECK DOWN TO YOUR TOES (washing for 3-5 minutes)  DO NOT use CHG soap on face, private areas, open wounds, or sores.  Pay special attention to the area where your surgery is being performed.  If you are having back surgery, having someone wash your back for you may be helpful. Wait 2 minutes after CHG soap is applied, then you may rinse off the CHG soap.  Pat dry with a clean towel  Put on clean pajamas    Additional instructions for the day of surgery: DO NOT APPLY any lotions,  powder,  oils,  deodorants (may use underarm deodorant) , cologne/ perfumes or makeup Do not wear jewelry / piercing's/ metal/ permanent jewelry must be removed prior to arrival day of surgery.  (No plastic piercing) Do not wear nail polish, gel polish, artificial nails, or any other type of covering on natural finger nails (toe nails are okay) Do not bring valuables to the hospital. [facility] is not responsible for valuables/personal belongings. Put on clean/comfortable clothes.  Please brush your teeth.  Ask your nurse before applying any prescription medications to the skin.

## 2024-08-19 NOTE — Progress Notes (Signed)
 Spoke w/ via phone for pre-op interview--- pt Lab needs dos----    upt     Lab results------ lab appt 08-22-2024 @ 1030 getting CBC/ T&S COVID test -----patient states asymptomatic no test needed Arrive at ------- 0900 on 08-23-2024 NPO after MN NO Solid Food.  Clear liquids from MN until--- 0800 Pre-Surgery Ensure or G2: n/a  Med rec completed Medications to take morning of surgery ----- prilosec Diabetic medication ----- n/a  GLP1 agonist last dose: n/a GLP1 instructions:  Patient instructed no nail polish to be worn day of surgery Patient instructed to bring photo id and insurance card day of surgery Patient aware to have Driver (ride ) / caregiver    for 24 hours after surgery - mother, Stacey Moore Patient Special Instructions ----- will pick up soap and written instructions at lab appt Pre-Op special Instructions ----- n/a  Patient verbalized understanding of instructions that were given at this phone interview. Patient denies chest pain, sob, fever, cough at the interview.

## 2024-08-22 ENCOUNTER — Encounter (HOSPITAL_COMMUNITY)
Admission: RE | Admit: 2024-08-22 | Discharge: 2024-08-22 | Disposition: A | Discharge status: Home / Self Care | Source: Ambulatory Visit | Attending: Obstetrics & Gynecology

## 2024-08-22 DIAGNOSIS — Z01812 Encounter for preprocedural laboratory examination: Secondary | ICD-10-CM | POA: Insufficient documentation

## 2024-08-22 DIAGNOSIS — D251 Intramural leiomyoma of uterus: Secondary | ICD-10-CM | POA: Insufficient documentation

## 2024-08-22 LAB — CBC
HCT: 47.1 % — ABNORMAL HIGH (ref 36.0–46.0)
Hemoglobin: 15.8 g/dL — ABNORMAL HIGH (ref 12.0–15.0)
MCH: 33.6 pg (ref 26.0–34.0)
MCHC: 33.5 g/dL (ref 30.0–36.0)
MCV: 100.2 fL — ABNORMAL HIGH (ref 80.0–100.0)
Platelets: 273 K/uL (ref 150–400)
RBC: 4.7 MIL/uL (ref 3.87–5.11)
RDW: 11.8 % (ref 11.5–15.5)
WBC: 6.4 K/uL (ref 4.0–10.5)
nRBC: 0 % (ref 0.0–0.2)

## 2024-08-22 NOTE — Progress Notes (Signed)
 43 y.o. G0P0000 Single Unavailable female here for discussion of upcoming procedure.  TLH/bilateral salpingectomy, possible oophrectomy, cystoscopy planned due to enlarged uterus with single sizable fibroid.  She has been on Depo Lupron  and had not had any bleeding since starting.  The uterus has decreased in size.  Laparoscopic procedure planned but need for minilaparotomy for removal of uterus is discussed.   Pre-op evaluation thus far has included ultrasound 06/03/2023 with uterus measuring 18 x 15 x 10cm with single fibroids measuring 14cm.  Repeat ultrasound has not been done but the uterus is much smaller.     Procedure discussed with patient.  Hospital stay, recovery and pain management all discussed.  Risks discussed including but not limited to bleeding, 1% risk of receiving a  transfusion, infection, 3-4% risk of bowel/bladder/ureteral/vascular injury discussed as well as possible need for additional surgery if injury does occur discussed.  DVT/PE and rare risk of death discussed.  My actual complications with prior surgeries discussed.  Vaginal cuff dehiscence discussed.  Hernia formation discussed.  Positioning and incision locations discussed.  Patient aware if pathology abnormal she may need additional treatment.  All questions answered.     Ob Hx:   No LMP recorded (lmp unknown). (Menstrual status: Other).          Sexually active: No. Birth control: abstinence Last pap: 05/24/2024 Last MMG: discussed having this done this year if possible.  Order will be placed. D  Smoking: yes, social smoking  Past Surgical History:  Procedure Laterality Date   LIPOMA EXCISION N/A 04/25/2016   Procedure: EXCISION LIPOMA; back  Surgeon: Lynda Leos, MD;  Location: [facility] OR;  Service: General;  Laterality: N/A;   LIPOMA EXCISION  2021   left upper back   WISDOM TOOTH EXTRACTION  2005    Past Medical History:  Diagnosis Date   GERD (gastroesophageal reflux disease)    Iron deficiency anemia due  to chronic blood loss    secondary to uterine fibroid  improvement w/ lupron  injection   Uterine fibroid     Allergies: Patient has no known allergies.  Current Outpatient Medications  Medication Sig Dispense Refill   cyanocobalamin  (VITAMIN B12) 1000 MCG tablet Take 1,000 mcg by mouth daily.     hydrOXYzine  (ATARAX ) 25 MG tablet Take 1 tablet (25 mg total) by mouth every 6 (six) hours as needed. 12 tablet 0   leuprolide  (LUPRON ) 3.75 MG injection Inject 3.75 mg into the muscle every 28 (twenty-eight) days. 1 kit 0   omeprazole (PRILOSEC) 20 MG capsule Take 20 mg by mouth daily as needed.     triamcinolone  cream (KENALOG ) 0.1 % Apply topically 2 (two) times daily. (Patient not taking: Reported on 08/19/2024) 30 g 0   valACYclovir  (VALTREX ) 1000 MG tablet Take 2 tablets (2,000 mg total) by mouth onset and repeat in 12 hours (Patient taking differently: Take 2,000 mg by mouth as needed.) 30 tablet 1   cetirizine (ZYRTEC) 10 MG tablet Take 10 mg by mouth daily as needed for allergies.     norethindrone  (AYGESTIN ) 5 MG tablet Take 1 tablet (5 mg total) by mouth daily. (Patient not taking: Reported on 08/19/2024) 90 tablet 1   No current facility-administered medications for this visit.    ROS: Pertinent items noted in HPI and remainder of comprehensive ROS otherwise negative.  Exam:   BP (!) 138/90   Pulse 77   Ht 5' 4 (1.626 m)   Wt 160 lb (72.6 kg)   LMP  (  LMP Unknown)   SpO2 100%   BMI 27.46 kg/m   General appearance: alert and cooperative Head: Normocephalic, without obvious abnormality, atraumatic Neck: no adenopathy, supple, symmetrical, trachea midline and thyroid not enlarged, symmetric, no tenderness/mass/nodules Lungs: clear to auscultation bilaterally Heart: regular rate and rhythm, S1, S2 normal, no murmur, click, rub or gallop Abdomen: soft, non-tender; bowel sounds normal; no masses,  no organomegaly Extremities: extremities normal, atraumatic, no cyanosis or  edema Skin: Skin color, texture, turgor normal. No rashes or lesions Lymph nodes: Cervical, supraclavicular, and axillary nodes normal. no inguinal nodes palpated Neurologic: Grossly normal  Pelvic: External genitalia:  no lesions              Urethra: normal appearing urethra with no masses, tenderness or lesions              Bartholins and Skenes: normal                 Vagina: normal appearing vagina with normal color and discharge, no lesions              Cervix: normal appearance              Pap taken: No.        Bimanual Exam:  Uterus:  anteverted and about 14-16 weeks size, larger on the right, mobile                                      Adnexa:    no masses                                      Rectovaginal: Deferred                                  Chaperone present for examination.   Assessment/Plan: 1. Menorrhagia with regular cycle (Primary) - TLH/bilateral salpingectomy/possible oophrectomy, cystoscopy planned.   - pre and post op instructions reviewed - pt would like FMLA paperwork updated.  She will have this sent to me. - post op pain medications reviewed - medications/Vitamins reviewed  2. Intramural uterine fibroid  3. History of anemia - CBC scheduled with pre op labs next week  4. Encounter for screening mammogram for malignant neoplasm of breast - MM 3D SCREENING MAMMOGRAM BILATERAL BREAST; Future  5. Tobacco use  6. Gastroesophageal reflux disease without esophagitis

## 2024-08-23 ENCOUNTER — Encounter (HOSPITAL_COMMUNITY)
Admission: RE | Disposition: A | Discharge status: Home / Self Care | Payer: Self-pay | Source: Home / Self Care | Attending: Obstetrics & Gynecology

## 2024-08-23 ENCOUNTER — Other Ambulatory Visit (HOSPITAL_BASED_OUTPATIENT_CLINIC_OR_DEPARTMENT_OTHER): Payer: Self-pay | Admitting: Obstetrics & Gynecology

## 2024-08-23 ENCOUNTER — Other Ambulatory Visit: Payer: Self-pay

## 2024-08-23 ENCOUNTER — Ambulatory Visit (HOSPITAL_COMMUNITY)
Admission: RE | Admit: 2024-08-23 | Discharge: 2024-08-24 | Disposition: A | Discharge status: Home / Self Care | Attending: Obstetrics & Gynecology | Admitting: Obstetrics & Gynecology

## 2024-08-23 ENCOUNTER — Other Ambulatory Visit (HOSPITAL_COMMUNITY): Payer: Self-pay

## 2024-08-23 ENCOUNTER — Ambulatory Visit (HOSPITAL_BASED_OUTPATIENT_CLINIC_OR_DEPARTMENT_OTHER): Payer: Self-pay | Admitting: Certified Registered Nurse Anesthetist

## 2024-08-23 ENCOUNTER — Encounter (HOSPITAL_COMMUNITY): Payer: Self-pay | Admitting: Obstetrics & Gynecology

## 2024-08-23 ENCOUNTER — Ambulatory Visit (HOSPITAL_COMMUNITY): Payer: Self-pay | Admitting: Certified Registered Nurse Anesthetist

## 2024-08-23 DIAGNOSIS — D252 Subserosal leiomyoma of uterus: Secondary | ICD-10-CM | POA: Diagnosis not present

## 2024-08-23 DIAGNOSIS — D251 Intramural leiomyoma of uterus: Secondary | ICD-10-CM | POA: Insufficient documentation

## 2024-08-23 DIAGNOSIS — K219 Gastro-esophageal reflux disease without esophagitis: Secondary | ICD-10-CM | POA: Diagnosis not present

## 2024-08-23 DIAGNOSIS — D259 Leiomyoma of uterus, unspecified: Secondary | ICD-10-CM | POA: Diagnosis not present

## 2024-08-23 DIAGNOSIS — F17290 Nicotine dependence, other tobacco product, uncomplicated: Secondary | ICD-10-CM | POA: Insufficient documentation

## 2024-08-23 DIAGNOSIS — N920 Excessive and frequent menstruation with regular cycle: Secondary | ICD-10-CM | POA: Diagnosis not present

## 2024-08-23 DIAGNOSIS — N888 Other specified noninflammatory disorders of cervix uteri: Secondary | ICD-10-CM | POA: Insufficient documentation

## 2024-08-23 DIAGNOSIS — Z01818 Encounter for other preprocedural examination: Secondary | ICD-10-CM

## 2024-08-23 HISTORY — PX: CYSTOSCOPY: SHX5120

## 2024-08-23 HISTORY — PX: TOTAL LAPAROSCOPIC HYSTERECTOMY WITH BILATERAL SALPINGO OOPHORECTOMY: SHX6845

## 2024-08-23 HISTORY — DX: Leiomyoma of uterus, unspecified: D25.9

## 2024-08-23 HISTORY — PX: ABDOMINAL HYSTERECTOMY: SHX81

## 2024-08-23 HISTORY — DX: Iron deficiency anemia secondary to blood loss (chronic): D50.0

## 2024-08-23 LAB — TYPE AND SCREEN
ABO/RH(D): A POS
Antibody Screen: NEGATIVE

## 2024-08-23 LAB — POCT PREGNANCY, URINE: Preg Test, Ur: NEGATIVE

## 2024-08-23 SURGERY — HYSTERECTOMY, TOTAL, LAPAROSCOPIC, WITH BILATERAL SALPINGO-OOPHORECTOMY
Anesthesia: General | Site: Pelvis

## 2024-08-23 MED ORDER — ROCURONIUM BROMIDE 10 MG/ML (PF) SYRINGE
PREFILLED_SYRINGE | INTRAVENOUS | Status: DC | PRN
  Administered 2024-08-23: 100 mg via INTRAVENOUS

## 2024-08-23 MED ORDER — PROPOFOL 500 MG/50ML IV EMUL
INTRAVENOUS | Status: DC | PRN
  Administered 2024-08-23: 50 ug/kg/min via INTRAVENOUS

## 2024-08-23 MED ORDER — IBUPROFEN 800 MG PO TABS
800.0000 mg | ORAL_TABLET | Freq: Three times a day (TID) | ORAL | 0 refills | Status: AC | PRN
Start: 2024-08-23 — End: ?
  Filled 2024-08-23: qty 30, 10d supply, fill #0

## 2024-08-23 MED ORDER — MORPHINE SULFATE (PF) 2 MG/ML IV SOLN: 1.0000 mg | INTRAVENOUS | Status: DC | PRN

## 2024-08-23 MED ORDER — VASOPRESSIN 20 UNIT/ML IV SOLN: INTRAVENOUS | Status: DC | PRN

## 2024-08-23 MED ORDER — ORAL CARE MOUTH RINSE: 15.0000 mL | Freq: Once | OROMUCOSAL | Status: AC

## 2024-08-23 MED ORDER — BUPIVACAINE HCL (PF) 0.25 % IJ SOLN
INTRAMUSCULAR | Status: AC
  Filled 2024-08-23: qty 30

## 2024-08-23 MED ORDER — LACTATED RINGERS IV SOLN: INTRAVENOUS | Status: DC

## 2024-08-23 MED ORDER — PHENYLEPHRINE HCL-NACL 20-0.9 MG/250ML-% IV SOLN
INTRAVENOUS | Status: DC | PRN
  Administered 2024-08-23: 30 ug/min via INTRAVENOUS

## 2024-08-23 MED ORDER — IBUPROFEN 600 MG PO TABS: 600.0000 mg | ORAL_TABLET | Freq: Four times a day (QID) | ORAL | Status: DC

## 2024-08-23 MED ORDER — LACTATED RINGERS IV SOLN: INTRAVENOUS | Status: DC | PRN

## 2024-08-23 MED ORDER — MIDAZOLAM HCL 2 MG/2ML IJ SOLN
INTRAMUSCULAR | Status: DC | PRN
  Administered 2024-08-23: 2 mg via INTRAVENOUS

## 2024-08-23 MED ORDER — PANTOPRAZOLE SODIUM 40 MG IV SOLR
40.0000 mg | Freq: Every day | INTRAVENOUS | Status: DC
  Administered 2024-08-23: 40 mg via INTRAVENOUS
  Filled 2024-08-23: qty 10

## 2024-08-23 MED ORDER — DEXMEDETOMIDINE HCL IN NACL 80 MCG/20ML IV SOLN
INTRAVENOUS | Status: DC | PRN
  Administered 2024-08-23: 8 ug via INTRAVENOUS

## 2024-08-23 MED ORDER — MIDAZOLAM HCL 2 MG/2ML IJ SOLN
INTRAMUSCULAR | Status: AC
  Filled 2024-08-23: qty 2

## 2024-08-23 MED ORDER — ENOXAPARIN SODIUM 40 MG/0.4ML IJ SOSY
PREFILLED_SYRINGE | INTRAMUSCULAR | Status: AC
  Filled 2024-08-23: qty 0.4

## 2024-08-23 MED ORDER — AMISULPRIDE (ANTIEMETIC) 5 MG/2ML IV SOLN
INTRAVENOUS | Status: AC
  Filled 2024-08-23: qty 4

## 2024-08-23 MED ORDER — ONDANSETRON HCL 4 MG/2ML IJ SOLN: 4.0000 mg | Freq: Once | INTRAMUSCULAR | Status: DC | PRN

## 2024-08-23 MED ORDER — ROCURONIUM BROMIDE 10 MG/ML (PF) SYRINGE
PREFILLED_SYRINGE | INTRAVENOUS | Status: AC
  Filled 2024-08-23: qty 10

## 2024-08-23 MED ORDER — SODIUM CHLORIDE 0.9 % IV SOLN
2.0000 g | INTRAVENOUS | Status: AC
  Administered 2024-08-23: 2 g via INTRAVENOUS
  Filled 2024-08-23: qty 2

## 2024-08-23 MED ORDER — BUPIVACAINE LIPOSOME 1.3 % IJ SUSP
INTRAMUSCULAR | Status: AC
  Filled 2024-08-23: qty 20

## 2024-08-23 MED ORDER — FENTANYL CITRATE (PF) 250 MCG/5ML IJ SOLN
INTRAMUSCULAR | Status: AC
Start: 2024-08-23 — End: 2024-08-23
  Filled 2024-08-23: qty 5

## 2024-08-23 MED ORDER — CHLORHEXIDINE GLUCONATE 0.12 % MT SOLN
15.0000 mL | Freq: Once | OROMUCOSAL | Status: AC
  Administered 2024-08-23: 15 mL via OROMUCOSAL

## 2024-08-23 MED ORDER — OXYCODONE-ACETAMINOPHEN 5-325 MG PO TABS
1.0000 | ORAL_TABLET | ORAL | Status: DC | PRN
  Administered 2024-08-23 – 2024-08-24 (×3): 2 via ORAL
  Filled 2024-08-23 (×3): qty 2

## 2024-08-23 MED ORDER — SCOPOLAMINE 1 MG/3DAYS TD PT72
MEDICATED_PATCH | TRANSDERMAL | Status: AC
  Filled 2024-08-23: qty 1

## 2024-08-23 MED ORDER — 0.9 % SODIUM CHLORIDE (POUR BTL) OPTIME
TOPICAL | Status: DC | PRN
  Administered 2024-08-23: 1000 mL

## 2024-08-23 MED ORDER — LIDOCAINE 2% (20 MG/ML) 5 ML SYRINGE
INTRAMUSCULAR | Status: AC
  Filled 2024-08-23: qty 5

## 2024-08-23 MED ORDER — SCOPOLAMINE 1 MG/3DAYS TD PT72
1.0000 | MEDICATED_PATCH | TRANSDERMAL | Status: DC
  Administered 2024-08-23: 1 mg via TRANSDERMAL

## 2024-08-23 MED ORDER — KETAMINE HCL 50 MG/5ML IJ SOSY
PREFILLED_SYRINGE | INTRAMUSCULAR | Status: AC
Start: 2024-08-23 — End: 2024-08-23
  Filled 2024-08-23: qty 5

## 2024-08-23 MED ORDER — ACETAMINOPHEN 500 MG PO TABS
1000.0000 mg | ORAL_TABLET | ORAL | Status: AC
  Administered 2024-08-23: 1000 mg via ORAL

## 2024-08-23 MED ORDER — VASOPRESSIN 20 UNIT/ML IV SOLN
INTRAVENOUS | Status: AC
  Filled 2024-08-23: qty 1

## 2024-08-23 MED ORDER — SIMETHICONE 80 MG PO CHEW
80.0000 mg | CHEWABLE_TABLET | Freq: Four times a day (QID) | ORAL | Status: DC | PRN
Start: 2024-08-23 — End: 2024-08-24

## 2024-08-23 MED ORDER — ALUM & MAG HYDROXIDE-SIMETH 200-200-20 MG/5ML PO SUSP: 30.0000 mL | ORAL | Status: DC | PRN

## 2024-08-23 MED ORDER — KETOROLAC TROMETHAMINE 30 MG/ML IJ SOLN: 30.0000 mg | Freq: Once | INTRAMUSCULAR | Status: DC | PRN

## 2024-08-23 MED ORDER — LIDOCAINE 2% (20 MG/ML) 5 ML SYRINGE
INTRAMUSCULAR | Status: DC | PRN
  Administered 2024-08-23: 60 mg via INTRAVENOUS

## 2024-08-23 MED ORDER — ACETAMINOPHEN 500 MG PO TABS
ORAL_TABLET | ORAL | Status: AC
  Filled 2024-08-23: qty 2

## 2024-08-23 MED ORDER — PROPOFOL 10 MG/ML IV BOLUS
INTRAVENOUS | Status: DC | PRN
  Administered 2024-08-23: 200 mg via INTRAVENOUS

## 2024-08-23 MED ORDER — SODIUM CHLORIDE (PF) 0.9 % IJ SOLN
INTRAMUSCULAR | Status: AC
  Filled 2024-08-23: qty 100

## 2024-08-23 MED ORDER — KETOROLAC TROMETHAMINE 30 MG/ML IJ SOLN
30.0000 mg | Freq: Four times a day (QID) | INTRAMUSCULAR | Status: DC
  Administered 2024-08-23 – 2024-08-24 (×2): 30 mg via INTRAVENOUS
  Filled 2024-08-23 (×2): qty 1

## 2024-08-23 MED ORDER — DEXTROSE-SODIUM CHLORIDE 5-0.45 % IV SOLN: INTRAVENOUS | Status: DC

## 2024-08-23 MED ORDER — DEXAMETHASONE SODIUM PHOSPHATE 10 MG/ML IJ SOLN
INTRAMUSCULAR | Status: DC | PRN
  Administered 2024-08-23: 10 mg via INTRAVENOUS

## 2024-08-23 MED ORDER — GABAPENTIN 100 MG PO CAPS
100.0000 mg | ORAL_CAPSULE | ORAL | Status: AC
  Administered 2024-08-23: 100 mg via ORAL

## 2024-08-23 MED ORDER — ONDANSETRON HCL 4 MG/2ML IJ SOLN
4.0000 mg | Freq: Four times a day (QID) | INTRAMUSCULAR | Status: DC | PRN
  Administered 2024-08-23: 4 mg via INTRAVENOUS
  Filled 2024-08-23: qty 2

## 2024-08-23 MED ORDER — BUPIVACAINE LIPOSOME 1.3 % IJ SUSP
INTRAMUSCULAR | Status: DC | PRN
  Administered 2024-08-23: 50 mL via SURGICAL_CAVITY

## 2024-08-23 MED ORDER — PROPOFOL 10 MG/ML IV BOLUS
INTRAVENOUS | Status: AC
  Filled 2024-08-23: qty 20

## 2024-08-23 MED ORDER — CHLORHEXIDINE GLUCONATE 0.12 % MT SOLN
OROMUCOSAL | Status: AC
  Filled 2024-08-23: qty 15

## 2024-08-23 MED ORDER — FENTANYL CITRATE (PF) 250 MCG/5ML IJ SOLN
INTRAMUSCULAR | Status: DC | PRN
Start: 2024-08-23 — End: 2024-08-23
  Administered 2024-08-23: 100 ug via INTRAVENOUS
  Administered 2024-08-23: 50 ug via INTRAVENOUS
  Administered 2024-08-23: 100 ug via INTRAVENOUS

## 2024-08-23 MED ORDER — HYDROMORPHONE HCL 1 MG/ML IJ SOLN
INTRAMUSCULAR | Status: AC
  Filled 2024-08-23: qty 1

## 2024-08-23 MED ORDER — ONDANSETRON HCL 4 MG PO TABS: 4.0000 mg | ORAL_TABLET | Freq: Four times a day (QID) | ORAL | Status: DC | PRN

## 2024-08-23 MED ORDER — KETOROLAC TROMETHAMINE 15 MG/ML IJ SOLN
INTRAMUSCULAR | Status: DC | PRN
Start: 2024-08-23 — End: 2024-08-23
  Administered 2024-08-23: 30 mg via INTRAVENOUS

## 2024-08-23 MED ORDER — PHENYLEPHRINE 80 MCG/ML (10ML) SYRINGE FOR IV PUSH (FOR BLOOD PRESSURE SUPPORT)
PREFILLED_SYRINGE | INTRAVENOUS | Status: DC | PRN
  Administered 2024-08-23: 160 ug via INTRAVENOUS
  Administered 2024-08-23: 80 ug via INTRAVENOUS

## 2024-08-23 MED ORDER — OXYCODONE-ACETAMINOPHEN 5-325 MG PO TABS
1.0000 | ORAL_TABLET | ORAL | 0 refills | Status: AC | PRN
  Filled 2024-08-23: qty 30, 3d supply, fill #0

## 2024-08-23 MED ORDER — KETOROLAC TROMETHAMINE 30 MG/ML IJ SOLN
INTRAMUSCULAR | Status: AC
  Filled 2024-08-23: qty 1

## 2024-08-23 MED ORDER — ENOXAPARIN SODIUM 40 MG/0.4ML IJ SOSY
40.0000 mg | PREFILLED_SYRINGE | INTRAMUSCULAR | Status: AC
  Administered 2024-08-23: 40 mg via SUBCUTANEOUS

## 2024-08-23 MED ORDER — ONDANSETRON HCL 4 MG/2ML IJ SOLN
INTRAMUSCULAR | Status: DC | PRN
  Administered 2024-08-23: 4 mg via INTRAVENOUS

## 2024-08-23 MED ORDER — POVIDONE-IODINE 10 % EX SWAB: 2.0000 | Freq: Once | CUTANEOUS | Status: DC

## 2024-08-23 MED ORDER — HYDROMORPHONE HCL 1 MG/ML IJ SOLN
INTRAMUSCULAR | Status: AC
  Filled 2024-08-23: qty 0.5

## 2024-08-23 MED ORDER — OXYCODONE HCL 5 MG/5ML PO SOLN: 5.0000 mg | Freq: Once | ORAL | Status: DC | PRN

## 2024-08-23 MED ORDER — GABAPENTIN 100 MG PO CAPS
ORAL_CAPSULE | ORAL | Status: AC
  Filled 2024-08-23: qty 1

## 2024-08-23 MED ORDER — SODIUM CHLORIDE 0.9 % IR SOLN
Status: DC | PRN
  Administered 2024-08-23: 1000 mL

## 2024-08-23 MED ORDER — KETAMINE HCL 10 MG/ML IJ SOLN
INTRAMUSCULAR | Status: DC | PRN
  Administered 2024-08-23 (×3): 10 mg via INTRAVENOUS

## 2024-08-23 MED ORDER — HYDROMORPHONE HCL 1 MG/ML IJ SOLN
0.2500 mg | INTRAMUSCULAR | Status: DC | PRN
  Administered 2024-08-23: 0.5 mg via INTRAVENOUS

## 2024-08-23 MED ORDER — MEPERIDINE HCL 25 MG/ML IJ SOLN: 6.2500 mg | INTRAMUSCULAR | Status: DC | PRN

## 2024-08-23 MED ORDER — HYDROMORPHONE HCL 1 MG/ML IJ SOLN
INTRAMUSCULAR | Status: DC | PRN
  Administered 2024-08-23: .5 mg via INTRAVENOUS

## 2024-08-23 MED ORDER — PROPOFOL 1000 MG/100ML IV EMUL
INTRAVENOUS | Status: AC
  Filled 2024-08-23: qty 100

## 2024-08-23 MED ORDER — MENTHOL 3 MG MT LOZG: 1.0000 | LOZENGE | OROMUCOSAL | Status: DC | PRN

## 2024-08-23 MED ORDER — AMISULPRIDE (ANTIEMETIC) 5 MG/2ML IV SOLN
10.0000 mg | Freq: Once | INTRAVENOUS | Status: AC | PRN
  Administered 2024-08-23: 10 mg via INTRAVENOUS

## 2024-08-23 MED ORDER — OXYCODONE HCL 5 MG PO TABS: 5.0000 mg | ORAL_TABLET | Freq: Once | ORAL | Status: DC | PRN

## 2024-08-23 MED ORDER — PHENYLEPHRINE 80 MCG/ML (10ML) SYRINGE FOR IV PUSH (FOR BLOOD PRESSURE SUPPORT)
PREFILLED_SYRINGE | INTRAVENOUS | Status: AC
  Filled 2024-08-23: qty 10

## 2024-08-23 MED ORDER — SUGAMMADEX SODIUM 200 MG/2ML IV SOLN
INTRAVENOUS | Status: DC | PRN
  Administered 2024-08-23: 200 mg via INTRAVENOUS

## 2024-08-23 SURGICAL SUPPLY — 59 items
APPLICATOR ARISTA FLEXITIP XL (MISCELLANEOUS) IMPLANT
BLADE SURG 10 STRL SS (BLADE) IMPLANT
COVER MAYO STAND STRL (DRAPES) ×2 IMPLANT
COVER SURGICAL LIGHT HANDLE (MISCELLANEOUS) ×2 IMPLANT
DERMABOND ADVANCED .7 DNX12 (GAUZE/BANDAGES/DRESSINGS) ×2 IMPLANT
DILATOR CANAL MILEX (MISCELLANEOUS) IMPLANT
DRAPE SURG IRRIG POUCH 19X23 (DRAPES) ×2 IMPLANT
DURAPREP 26ML APPLICATOR (WOUND CARE) ×2 IMPLANT
GAUZE 4X4 16PLY ~~LOC~~+RFID DBL (SPONGE) IMPLANT
GLOVE BIO SURGEON STRL SZ 6.5 (GLOVE) ×2 IMPLANT
GLOVE BIOGEL PI IND STRL 6.5 (GLOVE) ×2 IMPLANT
GLOVE ECLIPSE 6.5 STRL STRAW (GLOVE) ×4 IMPLANT
GLOVE SURG UNDER POLY LF SZ7 (GLOVE) ×8 IMPLANT
GOWN STRL REUS W/ TWL LRG LVL3 (GOWN DISPOSABLE) ×8 IMPLANT
GOWN STRL REUS W/ TWL XL LVL3 (GOWN DISPOSABLE) ×2 IMPLANT
HEMOSTAT ARISTA ABSORB 3G PWDR (HEMOSTASIS) IMPLANT
HIBICLENS CHG 4% 4OZ BTL (MISCELLANEOUS) ×2 IMPLANT
IRRIGATION SUCT STRKRFLW 2 WTP (MISCELLANEOUS) ×2 IMPLANT
KIT PINK PAD W/HEAD ARM REST (MISCELLANEOUS) ×2 IMPLANT
KIT TURNOVER KIT B (KITS) ×2 IMPLANT
LIGASURE VESSEL 5MM BLUNT TIP (ELECTROSURGICAL) ×2 IMPLANT
NDL INSUFFLATION 14GA 120MM (NEEDLE) ×2 IMPLANT
NDL SPNL 22GX7 QUINCKE BK (NEEDLE) IMPLANT
NEEDLE INSUFFLATION 14GA 120MM (NEEDLE) ×2 IMPLANT
NEEDLE SPNL 22GX7 QUINCKE BK (NEEDLE) ×2 IMPLANT
NS IRRIG 1000ML POUR BTL (IV SOLUTION) ×2 IMPLANT
OCCLUDER COLPOPNEUMO (BALLOONS) ×2 IMPLANT
PACK LAPAROSCOPY BASIN (CUSTOM PROCEDURE TRAY) ×2 IMPLANT
POUCH LAPAROSCOPIC INSTRUMENT (MISCELLANEOUS) ×2 IMPLANT
SCISSORS LAP 5X35 DISP (ENDOMECHANICALS) ×2 IMPLANT
SET CYSTO W/LG BORE CLAMP LF (SET/KITS/TRAYS/PACK) ×2 IMPLANT
SET TRI-LUMEN FLTR TB AIRSEAL (TUBING) ×2 IMPLANT
SET TUBE SMOKE EVAC HIGH FLOW (TUBING) ×2 IMPLANT
SHEARS HARMONIC 36 ACE (MISCELLANEOUS) ×2 IMPLANT
SLEEVE ADV FIXATION 5X100MM (TROCAR) IMPLANT
SLEEVE SCD COMPRESS KNEE MED (STOCKING) ×2 IMPLANT
SUT PLAIN ABS 2-0 CT1 27XMFL (SUTURE) IMPLANT
SUT VIC AB 0 CT1 27XBRD ANBCTR (SUTURE) ×4 IMPLANT
SUT VIC AB 2-0 SH 27XBRD (SUTURE) IMPLANT
SUT VIC AB 4-0 PS2 18 (SUTURE) ×4 IMPLANT
SUT VICRYL 0 UR6 27IN ABS (SUTURE) IMPLANT
SUT VLOC 180 0 9IN GS21 (SUTURE) ×2 IMPLANT
SYR 10ML LL (SYRINGE) ×2 IMPLANT
SYR 50ML LL SCALE MARK (SYRINGE) ×4 IMPLANT
SYR 50ML SLIP (SYRINGE) IMPLANT
SYR CONTROL 10ML LL (SYRINGE) IMPLANT
SYSTEM CARTER THOMASON II (TROCAR) IMPLANT
SYSTEM CONTND EXTRCTN KII BLLN (MISCELLANEOUS) IMPLANT
TIP RUMI ORANGE 6.7MMX12CM (TIP) IMPLANT
TIP UTERINE 6.7X10CM GRN DISP (MISCELLANEOUS) IMPLANT
TIP UTERINE 6.7X8CM BLUE DISP (MISCELLANEOUS) IMPLANT
TOWEL GREEN STERILE FF (TOWEL DISPOSABLE) ×4 IMPLANT
TRAY FOLEY W/BAG SLVR 14FR (SET/KITS/TRAYS/PACK) ×2 IMPLANT
TROCAR ADV FIXATION 5X100MM (TROCAR) ×2 IMPLANT
TROCAR KII 8X100ML NONTHREADED (TROCAR) ×2 IMPLANT
TROCAR PORT AIRSEAL 5X120 (TROCAR) ×2 IMPLANT
TROCAR XCEL NON-BLD 5MMX100MML (ENDOMECHANICALS) ×2 IMPLANT
UNDERPAD 30X36 HEAVY ABSORB (UNDERPADS AND DIAPERS) ×2 IMPLANT
WARMER LAPAROSCOPE (MISCELLANEOUS) ×2 IMPLANT

## 2024-08-23 NOTE — Discharge Instructions (Signed)
 Post Anesthesia Home Care Instructions  Activity: Get plenty of rest for the remainder of the day. A responsible individual must stay with you for 24 hours following the procedure.  For the next 24 hours, DO NOT: -Drive a car -Advertising copywriter -Drink alcoholic beverages -Take any medication unless instructed by your physician -Make any legal decisions or sign important papers.  Meals: Start with liquid foods such as gelatin or soup. Progress to regular foods as tolerated. Avoid greasy, spicy, heavy foods. If nausea and/or vomiting occur, drink only clear liquids until the nausea and/or vomiting subsides. Call your physician if vomiting continues.  Special Instructions/Symptoms: Your throat may feel dry or sore from the anesthesia or the breathing tube placed in your throat during surgery. If this causes discomfort, gargle with warm salt water. The discomfort should disappear within 24 hours.  If you had a scopolamine  patch placed behind your ear for the management of post- operative nausea and/or vomiting:  1. The medication in the patch is effective for 72 hours, after which it should be removed.  Wrap patch in a tissue and discard in the trash. Wash hands thoroughly with soap and water. 2. You may remove the patch earlier than 72 hours if you experience unpleasant side effects which may include dry mouth, dizziness or visual disturbances. 3. Avoid touching the patch. Wash your hands with soap and water after contact with the patch.    Remove patch behind left ear by Friday, August 25, 2024.  Information for Discharge Teaching: EXPAREL  (bupivacaine  liposome injectable suspension)   Pain relief is important to your recovery. The goal is to control your pain so you can move easier and return to your normal activities as soon as possible after your procedure. Your physician may use several types of medicines to manage pain, swelling, and more.  Your surgeon or anesthesiologist gave  you EXPAREL (bupivacaine ) to help control your pain after surgery.  EXPAREL  is a local anesthetic designed to release slowly over an extended period of time to provide pain relief by numbing the tissue around the surgical site. EXPAREL  is designed to release pain medication over time and can control pain for up to 72 hours. Depending on how you respond to EXPAREL , you may require less pain medication during your recovery. EXPAREL  can help reduce or eliminate the need for opioids during the first few days after surgery when pain relief is needed the most. EXPAREL  is not an opioid and is not addictive. It does not cause sleepiness or sedation.   Important! A teal colored band has been placed on your arm with the date, time and amount of EXPAREL  you have received. Please leave this armband in place for the full 96 hours following administration, and then you may remove the band. If you return to the hospital for any reason within 96 hours following the administration of EXPAREL , the armband provides important information that your health care providers to know, and alerts them that you have received this anesthetic.    Possible side effects of EXPAREL : Temporary loss of sensation or ability to move in the area where medication was injected. Nausea, vomiting, constipation Rarely, numbness and tingling in your mouth or lips, lightheadedness, or anxiety may occur. Call your doctor right away if you think you may be experiencing any of these sensations, or if you have other questions regarding possible side effects.  Follow all other discharge instructions given to you by your surgeon or nurse. Eat a healthy diet and drink plenty  of water or other fluids. Do not remove green armband before Saturday, August 26, 2024.

## 2024-08-23 NOTE — Progress Notes (Signed)
*   Day of Surgery * Procedure(s) (LRB): HYSTERECTOMY, TOTAL, LAPAROSCOPIC, WITH BILATERAL SALPINGECTOMY/ TAP BLOCK (Bilateral) CYSTOSCOPY (N/A)  Subjective: Patient reports some abdominal pain.  Wants to know what is normal.  Had a little bit of nausea but that has resolved.  Has walked and voided.  Eating dinner when I went into the room.  Burping but not flatus yet.    Objective: I have reviewed patient's vital signs, intake and output, medications, and labs. Vitals:   08/23/24 1827 08/23/24 1935  BP: 118/75 120/64  Pulse: 69   Resp: 16 16  Temp:  97.9 F (36.6 C)  SpO2: 99% 100%     General: alert and no distress Resp: clear to auscultation bilaterally Cardio: regular rate and rhythm, S1, S2 normal, no murmur, click, rub or gallop GI: soft, quite, appropriately tender, no masses, INC: c/d/i Extremities: extremities normal, atraumatic, no cyanosis or edema and SCDs in placed Vaginal Bleeding: none  Assessment: s/p Procedure(s) with comments: HYSTERECTOMY, TOTAL, LAPAROSCOPIC, WITH BILATERAL SALPINGECTOMY/ TAP BLOCK (Bilateral) - TLH/bilateral salpingectomy, possible oophorectomy, cystoscopy CYSTOSCOPY (N/A): progressing well  Plan: Advance diet Encourage ambulation Advance to PO medication Decrease IVF Hb in AM  LOS: 0 days    Ronal GORMAN Pinal, MD 08/23/2024, 8:40 PM

## 2024-08-23 NOTE — Transfer of Care (Signed)
 Immediate Anesthesia Transfer of Care Note  Patient: Stacey Moore  Procedure(s) Performed: HYSTERECTOMY, TOTAL, LAPAROSCOPIC, WITH BILATERAL SALPINGECTOMY/ TAP BLOCK (Bilateral: Pelvis) CYSTOSCOPY (Bladder)  Patient Location: PACU  Anesthesia Type:General  Level of Consciousness: awake and alert   Airway & Oxygen Therapy: Patient Spontanous Breathing and Patient connected to face mask oxygen  Post-op Assessment: Report given to RN and Post -op Vital signs reviewed and stable  Post vital signs: Reviewed and stable  Last Vitals:  Vitals Value Taken Time  BP 126/66 08/23/24 16:24  Temp    Pulse 80 08/23/24 16:27  Resp 7 08/23/24 16:27  SpO2 100 % 08/23/24 16:27  Vitals shown include unfiled device data.  Last Pain:  Vitals:   08/23/24 0939  TempSrc: Oral  PainSc: 0-No pain      Patients Stated Pain Goal: 3 (08/23/24 0939)  Complications: No notable events documented.

## 2024-08-23 NOTE — Op Note (Addendum)
 08/23/2024  4:31 PM  PATIENT:  Stacey Moore  43 y.o. female  PRE-OPERATIVE DIAGNOSIS:  Fibroids Mennorrhagia  POST-OPERATIVE DIAGNOSIS:  Fibroids Mennorrhagia  PROCEDURE:  Procedure(s): HYSTERECTOMY, TOTAL, LAPAROSCOPIC, WITH BILATERAL SALPINGECTOMY/ TAP BLOCK CYSTOSCOPY   SURGEON:  Ronal GORMAN Pinal  ASSISTANTS: Dr. Vina Lis.  An experienced assistant was required given the standard of surgical care given the complexity of the case.  This assistant was needed for exposure, dissection, suctioning, retraction, instrument exchange and for overall help during the procedure.  RNFA help was also unavailable.  ANESTHESIA:   general, TAP block with 20 cc of Exparel  and 20 cc of 0.25% marcaine  mixed for a total of 40 cc  ESTIMATED BLOOD LOSS: 50 mL  BLOOD ADMINISTERED:none   FLUIDS: 1000cc LR  UOP: 530cc clear UOP  SPECIMEN:  uterus, cervix and bilateral fallopian tubes  DISPOSITION OF SPECIMEN:  PATHOLOGY  FINDINGS: larger fibroid uterus, filling the pelvis, about 16 -18 weeks, normal ovaries, normal fallopian tubes, large volume stool in colon  DESCRIPTION OF OPERATION: Patient is taken to the operating room. She is placed in the supine position. She is a running IV in place. Informed consent was present on the chart. SCDs on her lower extremities and functioning properly. Patient was positioned while she was awake.  Her legs were placed in the low lithotomy position in [place] stirrups. Her arms were tucked by the side.  General endotracheal anesthesia was administered by the anesthesia staff without difficulty. Dr. Finucane, anesthesia, oversaw case.  Time out performed.    Clora prep was then used to prep the abdomen and Hibiclens  was used to prep the inner thighs, perineum and vagina. Once 3 minutes had past the patient was draped in a normal standard fashion. The legs were lifted to the high lithotomy position. The cervix was visualized by placing a heavy weighted speculum in  the posterior aspect of the vagina and using a curved Deaver retractor to the retract anteriorly. The anterior lip of the cervix was grasped with single-tooth tenaculum.  The cervix sounded to 12 cm. Pratt dilators were used to dilate the cervix up to a #17. A RUMI uterine manipulator was obtained. A #12 disposable tip was placed on the RUMI manipulator as well as a 2.5, silver KOH ring. This was passed through the cervix and the bulb of the disposable tip was inflated with 10 cc of normal saline. There was a good fit of the KOH ring around the cervix but this placement was difficult due to the position of the cervix.  It was to the right in the vaginal, likely due to deviation from the large fibroid uterus.  The tenaculum was removed. There is also good manipulation of the uterus. The speculum and retractor were removed as well. A Foley catheter was placed to straight drain.  Clear urine was noted. Legs were lowered to the low lithotomy position and attention was turned the abdomen.  The umbilicus was everted.  Marcaine  0.25% used to anesthetize the skin.  Using #11 blade, 5mm skin incision was made.  A Veress needle was obtained. Syringe of sterile saline was placed on a open Veress needle.  With the abdomen elevated, the Veress needle was passed into the umbilicus until the pop was heard and then fluid started to drip.  Then low flow CO2 gas was attached the needle and the pneumoperitoneum was achieved without difficulty. Once four liters of gas was in the abdomen the Veress needle was removed and a 5  millimeter non-bladed Optiview trocar and port were passed directly to the abdomen. The laparoscope was then used to confirm intraperitoneal placement. Findings included very large fibroid uterus that filled the pelvis.  Then transversus abdominus plane block was placed using laparoscopic guidance at T10 and T7 bilaterally, at the junction of the rectus anterior and band of oblique muscles.  10 cc of 0.25%  bupivacaine  mixed with Exparel  at each site, 40 cc total.  Doyle's bubble technique was used. This was placed for post operative pain management.  Then, locations for RLQ, LLQ, and suprapubic ports were noted by transillumination of the abdominal wall.  0.25% marcaine  was used to anesthetize the skin.  10mm skin incision was made in the LLQ and an AirSeal port was placed underdirect visualization of the laparoscope.  Then a 5mm skin incision was made and a 5mm nonbladed trochar and port was placed in the RLQ.  At this point, the suprapubic port was not placed but location for placement was identified.  All trochars were removed.    Ureters were identifies.  Attention was turned to the left side. With uterus on stretch the left utero-ovarian pedicle was serially clamped cauterized and incised using the ligasure device.  The Fallopian tube was left in placed because there was limited space in the pelvic to life the tube due to the large size of the uterus.  Decision made to remove the fallopian tube after the hysterectomy was completed.  Left round ligament was serially clamped cauterized and incised. The anterior and posterior peritoneum of the inferior leaf of the broad ligament were opened. The beginning of the bladder flap was created.  Very large veins were noted on both sides of the cervix.  The bladder was taken down below the level of the KOH ring. The left uterine artery skeletonized and then the uterine artery was clamped at the level of the superior cervix, above the KOH ring,  This was to help with back bleeding when doing the right side pedicles.    Attention was turned the right side.  The uterus was placed on stretch to the opposite side.  The tube was more easily seen on this side and the tube was excised off the ovary using the ligasure device.  The mesosalpinx was incised freeing the tube. Then the right uterine ovarian pedicle was serially clamped cauterized and incised. Next the right round  ligament was serially clamped cauterized and incised. The anterior posterior peritoneum of the inferiorly for the broad ligament were opened. The anterior peritoneum was carried across to the dissection on the left side. The remainder of the bladder flap was created using sharp dissection. The bladder was well below the level of the KOH ring. The right uterine artery skeletonized. Then the right uterine artery was cauterized high on the cervix, above the KOH ring.    Decision made at this point to go back below and ensure the KOH ring did indeed fit well around the cervix as placement was difficult at the beginning of the case.  The balloon tip was deflated and the RUMI manipulator, KOH ring and uterine tip were all removed.  Speculum was placed back in the vagina.  Cervix was grasped with a single toothed tenaculum. The cervix was in the midline at this point.  A #10 tip was attached to the RUMI manipulator and passed through the os.  The blood was inflated.  Then the 2.5 KOH ring was advanced to around the cervix.  Good placement was  confirmed.  Speculum was removed.    Sterile glove and gown was placed and legs lowered,  Attention was turned back to the abdomen.  Attention turned to the right side.  The uterine artery was cauterized just at the edge of the KOH ring and then incised.  Attention was turned to the other side and then the uterine artery was cauterized and incised.  The uterus was devascularized at this point.  The colpotomy was performed a starting in the midline and using a harmonic scalpel with the inferior edge of the open blade  This was carried around a circumferential fashion until the vaginal mucosa was completely incised in the specimen was freed.  The specimen was too large to be delivered through the vagina.  A vaginal occlusive device was used to maintain the pneumoperitoneum  Instruments were changed with a needle driver and Kobra graspers.  Using a 9 inch V. lock suture, the cuff  was closed by incorporating the anterior and posterior vaginal mucosa in each stitch. This was carried across all the way to the left corner and a running fashion. Two stitches were brought back towards the midline and the suture was cut flush with the vagina. The needle was brought out the pelvis. The pelvis was irrigated. All pedicles were inspected. No bleeding was noted.   Co2 pressures were lowered to 8mm Hg.  Again, no bleeding was noted.  Ureters were noted deep in the pelvis to be peristalsing.    Then a minilaparotomy incision was made about two centimeters above the pubic symphysis.  Fascia was identified and incised. This incision was extended with curved mayo scissors and gentle traction.  Alexis retractor was placed.  Then the uterus was morcellated.  It was 870 grams after removal.  Incision was closed at the fascial layer with #0 Vicryl in a running fashion.  Incision was cleansed.  Subcutaneous tissue was re-approximated with plain gut suture.    Pneumoperitoneum was achieved again.  Pelvis visualized and irrigated. No bleeding was noted.  Left tube was elevated and excised from the ovary with the ligasure device.  Tube was delivered through LLQ port.  Then attention was turned back to the cuff.  Arista was placed along the pedicles.  At this point the procedure was completed.  The remaining instruments were removed.  The ports (except the suprapubic port) were removed under direct visualization of the laparoscope and the pneumoperitoneum was relieved.  The LLQ fascia was closed with #0 vicryl under direct visualization with the laparoscope and using a Carter-Thommason device.  The patient was taken out of Trendelenburg positioning.  Several deep breaths were given to the patient's trying to any gas the abdomen and finally the suprapubic port was removed.  The skin was then closed with subcuticular stitches of 3-0 Vicryl. The skin was cleansed Dermabond was applied. Attention was then turned  the vagina and the cuff was inspected. No bleeding was noted. The anterior posterior vaginal mucosa was incorporated in each stitch. The Foley catheter was removed.  Cystoscopy was performed.  No sutures or bladder injuries were noted.  Ureters were noted with normal urine jets from each one was seen.  Foley was left out after the cystoscopic fluid was drained and cystoscope removed.  Sponge, lap, needle, instrument counts were correct x2. Patient tolerated the procedure very well. She was awakened from anesthesia, extubated and taken to recovery in stable condition.   COUNTS:  YES  PLAN OF CARE: Transfer to PACU

## 2024-08-23 NOTE — H&P (Signed)
 Stacey Moore is an 43 y.o. female G0 SWF with enlarged fibroid uterus and bulk symptoms as well as hx of iron deficiency anemia here for definitive treatment with TLH/bilateral salpingectomy/cystoscopy, possible oophrectomy.  She has been on Depo Lupron  to shrink the fibroid and decrease bleeding in preparation for surgery.  Hb is normal with pre op labs.  She is aware the uterus is too large to bring down through the vagina so the plan is to morcellate this in a bag through a smaller abdominal incision.  Risks and benefits have been discussed.  Alternatives including myomectomy and consult with REI have also been offered.  She is aware this will prevent her from ever carrying a pregnancy in the future and she is comfortable with her decision.  She is ready for definitive treatment.  She is with her mother today.  Questions answered.    Pertinent Gynecological History: Menses: none due to Lupron  use Bleeding: none currently Contraception: abstinence DES exposure: denies Blood transfusions: none Sexually transmitted diseases: h/o HSV Previous GYN Procedures: none  Last mammogram: ordered for her to start with screening MMG Last pap: normal Date: 05/24/2024 OB History: G0, P0   Menstrual History: No LMP recorded. Patient has had an injection.    Past Medical History:  Diagnosis Date   GERD (gastroesophageal reflux disease)    Iron deficiency anemia due to chronic blood loss    secondary to uterine fibroid  improvement w/ lupron  injection   Uterine fibroid     Past Surgical History:  Procedure Laterality Date   LIPOMA EXCISION N/A 04/25/2016   Procedure: EXCISION LIPOMA; back  Surgeon: Lynda Leos, MD;  Location: [facility] OR;  Service: General;  Laterality: N/A;   LIPOMA EXCISION  2021   left upper back   WISDOM TOOTH EXTRACTION  2005    Family History  Problem Relation Age of Onset   Hyperlipidemia Mother    Lung cancer Maternal Grandfather    Breast cancer Paternal  Grandmother        PMP, diagnosis in her 58s?    Social History:  reports that she has been smoking cigars. She has never used smokeless tobacco. She reports that she does not currently use alcohol. She reports that she does not use drugs.  Allergies: No Known Allergies  Medications Prior to Admission  Medication Sig Dispense Refill Last Dose/Taking   cetirizine (ZYRTEC) 10 MG tablet Take 10 mg by mouth daily as needed for allergies.   08/21/2024   cyanocobalamin  (VITAMIN B12) 1000 MCG tablet Take 1,000 mcg by mouth daily.   Past Month   leuprolide  (LUPRON ) 3.75 MG injection Inject 3.75 mg into the muscle every 28 (twenty-eight) days. 1 kit 0 08/14/2024   omeprazole (PRILOSEC) 20 MG capsule Take 20 mg by mouth daily as needed.   08/23/2024 at  7:50 AM   valACYclovir  (VALTREX ) 1000 MG tablet Take 2 tablets (2,000 mg total) by mouth onset and repeat in 12 hours (Patient taking differently: Take 2,000 mg by mouth as needed.) 30 tablet 1 08/21/2024   hydrOXYzine  (ATARAX ) 25 MG tablet Take 1 tablet (25 mg total) by mouth every 6 (six) hours as needed. 12 tablet 0 More than a month   norethindrone  (AYGESTIN ) 5 MG tablet Take 1 tablet (5 mg total) by mouth daily. (Patient not taking: Reported on 08/19/2024) 90 tablet 1 Unknown   triamcinolone  cream (KENALOG ) 0.1 % Apply topically 2 (two) times daily. (Patient not taking: Reported on 08/19/2024) 30 g 0 Unknown  Review of Systems  Constitutional: Negative.   Respiratory: Negative.    Cardiovascular: Negative.   Gastrointestinal: Negative.   Genitourinary: Negative.   Musculoskeletal: Negative.   Hematological: Negative.   Psychiatric/Behavioral: Negative.      Blood pressure 133/85, pulse 69, temperature 98 F (36.7 C), temperature source Oral, resp. rate 16, height 5' 4 (1.626 m), weight 72.6 kg, SpO2 99%. Physical Exam Constitutional:      Appearance: Normal appearance.  HENT:     Head: Normocephalic and atraumatic.  Cardiovascular:      Rate and Rhythm: Normal rate and regular rhythm.     Pulses: Normal pulses.     Heart sounds: Normal heart sounds.  Musculoskeletal:     Cervical back: Normal range of motion and neck supple.  Neurological:     General: No focal deficit present.     Mental Status: She is alert.  Psychiatric:        Mood and Affect: Mood normal.        Behavior: Behavior normal.     Results for orders placed or performed during the hospital encounter of 08/23/24 (from the past 24 hours)  Pregnancy, urine POC     Status: None   Collection Time: 08/23/24  9:18 AM  Result Value Ref Range   Preg Test, Ur NEGATIVE NEGATIVE  ABO/Rh     Status: None   Collection Time: 08/23/24  9:56 AM  Result Value Ref Range   ABO/RH(D)      A POS Performed at [facility], [address], [place], [place] [address]     No results found.  Assessment/Plan: 43 yo G0 SWF with large fibroid uterus, h/o depo lupron  use to shrink the fibroids, h/o iron deficiency anemia which has resolved, and bulk symptoms which have improved with the Depo Lupron  here for definitive treatment with TLH/bilateral salpingectomy, cystoscopy, possible oophrectomy.  Questions answered.  Pt ready to proceed.  Ronal GORMAN Pinal 08/23/2024, 11:39 AM

## 2024-08-23 NOTE — Progress Notes (Signed)
 Updated mother re: surgery going well with very little blood loss.  Just taking time to get the uterus out.  Pt's mother reassured.

## 2024-08-23 NOTE — Anesthesia Preprocedure Evaluation (Addendum)
 Anesthesia Evaluation  Patient identified by MRN, date of birth, ID band Patient awake    Reviewed: Allergy & Precautions, H&P , NPO status , Patient's Chart, lab work & pertinent test results  Airway Mallampati: III  TM Distance: >3 FB Neck ROM: Full    Dental  (+) Teeth Intact, Dental Advisory Given   Pulmonary Current Smoker and Patient abstained from smoking. 2-3 cigars/d, none today    Pulmonary exam normal breath sounds clear to auscultation       Cardiovascular hypertension (133/85 preop, no home meds), Normal cardiovascular exam Rhythm:Regular Rate:Normal     Neuro/Psych negative neurological ROS  negative psych ROS   GI/Hepatic Neg liver ROS,GERD  Controlled and Medicated,,  Endo/Other  negative endocrine ROS    Renal/GU negative Renal ROS  negative genitourinary   Musculoskeletal negative musculoskeletal ROS (+)    Abdominal   Peds negative pediatric ROS (+)  Hematology negative hematology ROS (+) Hb 15.8, plt 273   Anesthesia Other Findings   Reproductive/Obstetrics negative OB ROS Uterine fibroids, menorrhagia                              Anesthesia Physical Anesthesia Plan  ASA: 2  Anesthesia Plan: General   Post-op Pain Management: Tylenol  PO (pre-op)*, Toradol  IV (intra-op)*, Precedex , Ketamine  IV* and Dilaudid  IV   Induction: Intravenous  PONV Risk Score and Plan: 3 and Ondansetron , Dexamethasone , Midazolam , Scopolamine  patch - Pre-op and Treatment may vary due to age or medical condition  Airway Management Planned: Oral ETT  Additional Equipment: None  Intra-op Plan:   Post-operative Plan: Extubation in OR  Informed Consent: I have reviewed the patients History and Physical, chart, labs and discussed the procedure including the risks, benefits and alternatives for the proposed anesthesia with the patient or authorized representative who has indicated  his/her understanding and acceptance.     Dental advisory given  Plan Discussed with: CRNA  Anesthesia Plan Comments:          Anesthesia Quick Evaluation

## 2024-08-23 NOTE — Anesthesia Procedure Notes (Signed)
 Procedure Name: Intubation Date/Time: 08/23/2024 12:21 PM  Performed by: Scherrie Mast, CRNAPre-anesthesia Checklist: Patient identified, Emergency Drugs available, Suction available and Patient being monitored Patient Re-evaluated:Patient Re-evaluated prior to induction Oxygen Delivery Method: Circle System Utilized Preoxygenation: Pre-oxygenation with 100% oxygen Induction Type: IV induction Ventilation: Mask ventilation without difficulty Laryngoscope Size: Mac and 3 Grade View: Grade I Tube type: Oral Tube size: 7.0 mm Number of attempts: 1 Airway Equipment and Method: Stylet and Oral airway Placement Confirmation: ETT inserted through vocal cords under direct vision, positive ETCO2 and breath sounds checked- equal and bilateral Secured at: 21 cm Tube secured with: Tape Dental Injury: Teeth and Oropharynx as per pre-operative assessment

## 2024-08-24 ENCOUNTER — Encounter (HOSPITAL_COMMUNITY): Payer: Self-pay | Admitting: Obstetrics & Gynecology

## 2024-08-24 DIAGNOSIS — K219 Gastro-esophageal reflux disease without esophagitis: Secondary | ICD-10-CM | POA: Diagnosis not present

## 2024-08-24 DIAGNOSIS — N888 Other specified noninflammatory disorders of cervix uteri: Secondary | ICD-10-CM | POA: Diagnosis not present

## 2024-08-24 DIAGNOSIS — D251 Intramural leiomyoma of uterus: Secondary | ICD-10-CM | POA: Diagnosis not present

## 2024-08-24 DIAGNOSIS — F17290 Nicotine dependence, other tobacco product, uncomplicated: Secondary | ICD-10-CM | POA: Diagnosis not present

## 2024-08-24 LAB — HEMOGLOBIN: Hemoglobin: 12.9 g/dL (ref 12.0–15.0)

## 2024-08-24 LAB — ABO/RH: ABO/RH(D): A POS

## 2024-08-24 NOTE — Anesthesia Postprocedure Evaluation (Signed)
 Anesthesia Post Note  Patient: Stacey Moore  Procedure(s) Performed: HYSTERECTOMY, TOTAL, LAPAROSCOPIC, WITH BILATERAL SALPINGECTOMY/ TAP BLOCK (Bilateral: Pelvis) CYSTOSCOPY (Bladder)     Patient location during evaluation: PACU Anesthesia Type: General Level of consciousness: awake and alert Pain management: pain level controlled Vital Signs Assessment: post-procedure vital signs reviewed and stable Respiratory status: spontaneous breathing, nonlabored ventilation, respiratory function stable and patient connected to nasal cannula oxygen Cardiovascular status: blood pressure returned to baseline and stable Postop Assessment: no apparent nausea or vomiting Anesthetic complications: no   No notable events documented.  Last Vitals:  Vitals:   08/24/24 0401 08/24/24 0816  BP: (!) 97/46 98/62  Pulse: (!) 58 62  Resp: 16 16  Temp: 36.5 C 36.4 C  SpO2: 100% 100%    Last Pain:  Vitals:   08/24/24 0829  TempSrc:   PainSc: 4                  [name] P Knolan Simien

## 2024-08-24 NOTE — Progress Notes (Signed)
 1 Day Post-Op Procedure(s) (LRB): HYSTERECTOMY, TOTAL, LAPAROSCOPIC, WITH BILATERAL SALPINGECTOMY/ TAP BLOCK (Bilateral) CYSTOSCOPY (N/A)  Subjective: Patient reports incisional pain but good pain control.  Passed flatus.  Voiding without difficulties.  Denies vaginal bleeding.    Objective: I have reviewed patient's vital signs, intake and output, medications, and labs. Vitals:   08/23/24 2323 08/24/24 0401  BP: 90/62 (!) 97/46  Pulse: 67 (!) 58  Resp: 16 16  Temp: 97.9 F (36.6 C) 97.7 F (36.5 C)  SpO2: 98% 100%   Post op hb 12.9  Physical Exam Constitutional:      Appearance: Normal appearance.  Cardiovascular:     Rate and Rhythm: Normal rate and regular rhythm.  Pulmonary:     Breath sounds: Normal breath sounds.  Abdominal:     General: Abdomen is flat. Bowel sounds are normal.     Palpations: Abdomen is soft.     Comments: Inc:  C/D/I  Neurological:     General: No focal deficit present.     Mental Status: She is alert.     Assessment: s/p Procedure(s) with comments: HYSTERECTOMY, TOTAL, LAPAROSCOPIC, WITH BILATERAL SALPINGECTOMY/ TAP BLOCK (Bilateral) - TLH/bilateral salpingectomy, possible oophorectomy, cystoscopy CYSTOSCOPY (N/A): stable and progressing well  Plan: Discharge home  LOS: 0 days    Ronal GORMAN Pinal, MD 08/24/2024, 7:40 AM

## 2024-08-26 LAB — SURGICAL PATHOLOGY

## 2024-09-01 ENCOUNTER — Encounter (HOSPITAL_BASED_OUTPATIENT_CLINIC_OR_DEPARTMENT_OTHER): Payer: Self-pay | Admitting: Obstetrics & Gynecology

## 2024-09-01 ENCOUNTER — Ambulatory Visit (HOSPITAL_BASED_OUTPATIENT_CLINIC_OR_DEPARTMENT_OTHER): Payer: Self-pay | Admitting: Obstetrics & Gynecology

## 2024-09-01 VITALS — BP 134/89 | HR 72 | Ht 64.0 in | Wt 155.8 lb

## 2024-09-01 DIAGNOSIS — Z9889 Other specified postprocedural states: Secondary | ICD-10-CM

## 2024-09-01 NOTE — Progress Notes (Signed)
 GYNECOLOGY  VISIT  CC:   post op recheck  HPI: 43 y.o. G0P0000 Single Unavailable female here for recheck after undergoing TLH/bilateral salpingectomy, cystoscopy on 8/26.  She reports bleeding is none.  She has minimal pain.  Bowel function is Normal.  Bladder function is normal.    Pathology reviewed:  Yes .  Questions answered.    MEDS:   Current Outpatient Medications on File Prior to Visit  Medication Sig Dispense Refill   cetirizine (ZYRTEC) 10 MG tablet Take 10 mg by mouth daily as needed for allergies.     cyanocobalamin  (VITAMIN B12) 1000 MCG tablet Take 1,000 mcg by mouth daily.     hydrOXYzine  (ATARAX ) 25 MG tablet Take 1 tablet (25 mg total) by mouth every 6 (six) hours as needed. 12 tablet 0   ibuprofen  (ADVIL ) 800 MG tablet Take 1 tablet (800 mg total) by mouth every 8 (eight) hours as needed. 30 tablet 0   omeprazole (PRILOSEC) 20 MG capsule Take 20 mg by mouth daily as needed.     triamcinolone  cream (KENALOG ) 0.1 % Apply topically 2 (two) times daily. (Patient not taking: Reported on 08/19/2024) 30 g 0   valACYclovir  (VALTREX ) 1000 MG tablet Take 2 tablets (2,000 mg total) by mouth onset and repeat in 12 hours (Patient taking differently: Take 2,000 mg by mouth as needed.) 30 tablet 1   No current facility-administered medications on file prior to visit.    SH:  Smoking Yes , socially.   PHYSICAL EXAMINATION:    BP 134/89   Pulse 72   Ht 5' 4 (1.626 m) Comment: Reported  Wt 155 lb 12.8 oz (70.7 kg)   LMP  (LMP Unknown)   BMI 26.74 kg/m     General appearance: alert, cooperative and appears stated age CV:  Regular rate and rhythm Lungs:  clear to auscultation, no wheezes, rales or rhonchi, symmetric air entry Abdomen: soft, non-tender; bowel sounds normal; no masses,  no organomegaly Incisions:  C/D/I  Pelvic: deferred   Assessment/Plan: 1. Post-operative state (Primary) - will change appt to one month from now.

## 2024-09-14 ENCOUNTER — Encounter (HOSPITAL_BASED_OUTPATIENT_CLINIC_OR_DEPARTMENT_OTHER): Payer: Self-pay | Admitting: Obstetrics & Gynecology

## 2024-09-14 ENCOUNTER — Ambulatory Visit (HOSPITAL_BASED_OUTPATIENT_CLINIC_OR_DEPARTMENT_OTHER): Admitting: Obstetrics & Gynecology

## 2024-09-14 VITALS — BP 131/93 | HR 88 | Wt 158.0 lb

## 2024-09-14 DIAGNOSIS — Z9889 Other specified postprocedural states: Secondary | ICD-10-CM

## 2024-09-14 NOTE — Progress Notes (Signed)
 GYNECOLOGY  VISIT  CC:   post op recheck  HPI: 43 y.o. G0P0000 Single Unavailable female here for recheck after undergoing TLH/bilateral salpingectomy, cystoscopy on 08/23/2024.  She has been increasing her activity over the weekend and she's had some spotting.  It was just light pink.  Denies cramping.  Energy is improving.  Bladder and bowel function is normal.  Intermittently taking ibuprofen .  Denies fevers.    MEDS:   Current Outpatient Medications on File Prior to Visit  Medication Sig Dispense Refill   cetirizine (ZYRTEC) 10 MG tablet Take 10 mg by mouth daily as needed for allergies.     ibuprofen  (ADVIL ) 800 MG tablet Take 1 tablet (800 mg total) by mouth every 8 (eight) hours as needed. 30 tablet 0   omeprazole (PRILOSEC) 20 MG capsule Take 20 mg by mouth daily as needed.     cyanocobalamin  (VITAMIN B12) 1000 MCG tablet Take 1,000 mcg by mouth daily. (Patient not taking: Reported on 09/14/2024)     hydrOXYzine  (ATARAX ) 25 MG tablet Take 1 tablet (25 mg total) by mouth every 6 (six) hours as needed. (Patient not taking: Reported on 09/14/2024) 12 tablet 0   triamcinolone  cream (KENALOG ) 0.1 % Apply topically 2 (two) times daily. (Patient not taking: Reported on 09/14/2024) 30 g 0   valACYclovir  (VALTREX ) 1000 MG tablet Take 2 tablets (2,000 mg total) by mouth onset and repeat in 12 hours (Patient taking differently: Take 2,000 mg by mouth as needed.) 30 tablet 1   No current facility-administered medications on file prior to visit.    SH:  Smoking social smoking   PHYSICAL EXAMINATION:    BP (!) 131/93 (BP Location: Right Arm, Patient Position: Sitting, Cuff Size: Normal)   Pulse 88   Wt 158 lb (71.7 kg)   LMP  (LMP Unknown)   SpO2 99%   BMI 27.12 kg/m     General appearance: alert, cooperative and appears stated age Abdomen: soft, non-tender; bowel sounds normal; no masses,  no organomegaly Incisions:  C/D/I  Pelvic: External genitalia:  no lesions              Urethra:   normal appearing urethra with no masses, tenderness or lesions              Bartholins and Skenes: normal                 Vagina: normal appearing vagina with normal color and discharge, no lesions, cuff well approximated              Cervix: surgically absent            Chaperone was present for exam.  Assessment/Plan: 1. Post-operative state (Primary) - continued pelvic rest discussed.  She is going to decrease some activity for now.  Will keep post op appt in 2 -3 weeks.

## 2024-09-22 ENCOUNTER — Encounter (HOSPITAL_BASED_OUTPATIENT_CLINIC_OR_DEPARTMENT_OTHER): Payer: Self-pay | Admitting: Obstetrics & Gynecology

## 2024-09-27 ENCOUNTER — Encounter: Payer: Self-pay | Admitting: Family Medicine

## 2024-09-27 ENCOUNTER — Ambulatory Visit (INDEPENDENT_AMBULATORY_CARE_PROVIDER_SITE_OTHER): Admitting: Family Medicine

## 2024-09-27 VITALS — BP 120/80 | HR 96 | Ht 64.0 in

## 2024-09-27 DIAGNOSIS — Z0000 Encounter for general adult medical examination without abnormal findings: Secondary | ICD-10-CM | POA: Diagnosis not present

## 2024-09-27 DIAGNOSIS — R2231 Localized swelling, mass and lump, right upper limb: Secondary | ICD-10-CM

## 2024-09-27 NOTE — Patient Instructions (Signed)

## 2024-09-27 NOTE — Progress Notes (Signed)
 Phone [phone]   Subjective:   Patient is a 43 y.o. female presenting for annual physical.    Chief Complaint  Patient presents with   Annual Exam    Pt is present for cpe    Annual-exercise Discussed the use of AI scribe software for clinical note transcription with the patient, who gave verbal consent to proceed.  History of Present Illness Stacey Moore is a 43 year old female who presents for an annual physical exam.  She is recovering from a recent hysterectomy performed for fibroids, with her ovaries retained?. She had anemia before the surgery and took lupron  to stop her periods in preparation for the surgery. She is no longer taking B12, hydroxyzine , or triamcinolone  cream. She uses omeprazole and valacyclovir  as needed.  She has a persistent knot in her right armpit, which feels different from the left. An ultrasound was previously ordered but not completed due to a high deductible. She plans to schedule it before the end of the year as she has now met her deductible.  She inquires about a dermatology referral for a spot on her left forehead. This spot was treated with a topical cream a few years ago, initially resolving the issue, but it has since returned and has been present for about a year.  She continues to smoke cigars and is trying to quit. She has not been exercising since her surgery due to restrictions but was active prior to the procedure.  No major headaches, migraines, dizziness, runny nose, congestion, sore throat, chest pains, heart racing, skipping beats, coughing, wheezing, shortness of breath, vomiting, diarrhea, constipation, trouble urinating, muscle aches, joint pains, arthritis, depression, or suicidal thoughts.    See problem oriented charting- ROS- ROS: Gen: no fever, chills  Skin: no rash, itching.  Does have spot ENT: no ear pain, ear drainage, nasal congestion, rhinorrhea, sinus pressure, sore throat Eyes: no blurry vision, double  vision Resp: no cough, wheeze,SOB CV: no CP, palpitations, LE edema,  GI: no heartburn, n/v/d/c, abd pain GU: no dysuria, urgency, frequency, hematuria MSK: no joint pain, myalgias, back pain Neuro: no dizziness, headache, weakness, vertigo Psych: no depression, anxiety, insomnia, SI   The following were reviewed and entered/updated in epic: Past Medical History:  Diagnosis Date   GERD (gastroesophageal reflux disease)    Iron deficiency anemia due to chronic blood loss    secondary to uterine fibroid  improvement w/ lupron  injection   Uterine fibroid    Patient Active Problem List   Diagnosis Date Noted   Prediabetes 11/17/2023   Fever blister 11/30/2021   Tobacco use 02/23/2021   Vitamin B12 deficiency 02/23/2021   Bartholin cyst 07/31/2020   Lipoma 07/23/2015   GERD (gastroesophageal reflux disease) 07/23/2015   Allergic rhinitis 07/23/2015   Past Surgical History:  Procedure Laterality Date   ABDOMINAL HYSTERECTOMY  08/23/2024   CYSTOSCOPY N/A 08/23/2024   Procedure: CYSTOSCOPY;  Surgeon: Cleotilde Ronal RAMAN, MD;  Location: [facility] OR;  Service: Gynecology;  Laterality: N/A;   LIPOMA EXCISION N/A 04/25/2016   Procedure: EXCISION LIPOMA; back  Surgeon: Lynda Leos, MD;  Location: [facility] OR;  Service: General;  Laterality: N/A;   LIPOMA EXCISION  2021   left upper back   TOTAL LAPAROSCOPIC HYSTERECTOMY WITH BILATERAL SALPINGO OOPHORECTOMY Bilateral 08/23/2024   Procedure: HYSTERECTOMY, TOTAL, LAPAROSCOPIC, WITH BILATERAL SALPINGECTOMY/ TAP BLOCK;  Surgeon: Cleotilde Ronal RAMAN, MD;  Location: [facility] OR;  Service: Gynecology;  Laterality: Bilateral;  TLH/bilateral salpingectomy, possible oophorectomy, cystoscopy   WISDOM TOOTH  EXTRACTION  2005    Family History  Problem Relation Age of Onset   Hyperlipidemia Mother    Lung cancer Maternal Grandfather    Breast cancer Paternal Grandmother        PMP, diagnosis in her 74s?    Medications- reviewed and updated Current Outpatient  Medications  Medication Sig Dispense Refill   cetirizine (ZYRTEC) 10 MG tablet Take 10 mg by mouth daily as needed for allergies.     ibuprofen  (ADVIL ) 800 MG tablet Take 1 tablet (800 mg total) by mouth every 8 (eight) hours as needed. 30 tablet 0   omeprazole (PRILOSEC) 20 MG capsule Take 20 mg by mouth daily as needed.     valACYclovir  (VALTREX ) 1000 MG tablet Take 2 tablets (2,000 mg total) by mouth onset and repeat in 12 hours (Patient taking differently: Take 2,000 mg by mouth as needed.) 30 tablet 1   No current facility-administered medications for this visit.    Allergies-reviewed and updated No Known Allergies  Social History   Social History Narrative   Not on file   Objective  Objective:  BP 120/80   Pulse 96   Ht 5' 4 (1.626 m)   LMP  (LMP Unknown)   SpO2 98%   BMI 27.12 kg/m  Physical Exam  Gen: WDWN NAD HEENT: NCAT, conjunctiva not injected, sclera nonicteric TM WNL B, OP moist, no exudates  NECK:  supple, no thyromegaly, no nodes, no carotid bruits CARDIAC: RRR, S1S2+, no murmur. DP 2+B LUNGS: CTAB. No wheezes ABDOMEN:  BS+, soft, NTND, No HSM, no masses EXT:  no edema MSK: no gross abnormalities. MS 5/5 all 4 NEURO: A&O x3.  CN II-XII intact.  PSYCH: normal mood. Good eye contact     Assessment and Plan   Health Maintenance counseling: 1. Anticipatory guidance: Patient counseled regarding regular dental exams q6 months, eye exams,  avoiding smoking and second hand smoke, limiting alcohol to 1 beverage per day, no illicit drugs.   2. Risk factor reduction:  Advised patient of need for regular exercise and diet rich and fruits and vegetables to reduce risk of heart attack and stroke. Exercise- will restart.  Wt Readings from Last 3 Encounters:  09/14/24 158 lb (71.7 kg)  09/01/24 155 lb 12.8 oz (70.7 kg)  08/23/24 160 lb (72.6 kg)   3. Immunizations/screenings/ancillary studies Immunization History  Administered Date(s) Administered   HPV 9-valent  01/23/2021, 03/26/2021, 05/14/2022   Influenza,inj,Quad PF,6+ Mos 10/26/2019   Influenza-Unspecified 09/29/2015, 10/16/2016, 09/04/2021, 10/17/2022   Janssen (J&J) SARS-COV-2 Vaccination 09/13/2020   Moderna Covid-19 Vaccine Bivalent Booster 42yrs & up 01/14/2022   Tdap 12/30/2007, 04/23/2018   Health Maintenance Due  Topic Date Due   Mammogram  Never done    4. Cervical cancer screening- utd 5. Breast cancer screening-  mammogram will sch 6. Colon cancer screening - n/a 7. Skin cancer screening- advised regular sunscreen use. Denies worrisome, changing, or new skin lesions.  8. Birth control/STD check- n/a 9. Osteoporosis screening- n/a 10. Smoking associated screening - + smoker-counseled   Wellness examination -     Lipid panel -     Comprehensive metabolic panel with GFR -     CBC with Differential/Platelet -     Hemoglobin A1c -     TSH  Mass of right axilla -     US  AXILLA RIGHT; Future   Wellness-anticipatory guidance.  Work on Diet/Exercise  Check CBC,CMP,lipids,TSH, A1C.  F/u 1 yr  Assessment and Plan Assessment &  Plan Right axillary lump   A persistent lump in the right axilla feels different from the left side. She plans to schedule an ultrasound before the end of the year due to a previously high deductible. Order an ultrasound of the right axilla at the breast center and coordinate its scheduling with her upcoming mammogram.  Recurrent lesion of left forehead   A recurrent lesion on the left forehead, previously treated with topical cream, initially resolved but has recurred over the past year. She seeks further management through a dermatology evaluation. Advise her to contact dermatology offices to schedule an appointment and offer a referral to [facility] dermatology group if needed.  Status post hysterectomy for uterine fibroids   She recently underwent a hysterectomy for uterine fibroids, with ovaries likely retained. Advise her to confirm the status of her ovaries  with her surgeon. Recovery from surgery is ongoing.  Tobacco use (cigar smoking)   She continues to use cigars but is attempting to quit. Encourage her smoking cessation efforts and discuss potential resources and strategies for quitting.  Adult Wellness Visit   During her routine annual physical examination, she reported no major complaints or concerns. Recovery from her recent hysterectomy is ongoing, with no significant changes in health status since the last visit. Perform routine blood work, including cholesterol and glucose levels. Encourage healthy lifestyle choices, including regular exercise, a healthy diet, and smoking cessation. Advise on safety measures such as wearing seatbelts and avoiding drinking and driving.    Recommended follow up: Return in about 1 year (around 09/27/2025) for annual physical.  Lab/Order associations:breakfast bar 2 hr fasting  Jenkins CHRISTELLA Carrel, MD

## 2024-09-28 ENCOUNTER — Ambulatory Visit: Payer: Self-pay | Admitting: Family Medicine

## 2024-09-28 ENCOUNTER — Encounter (HOSPITAL_BASED_OUTPATIENT_CLINIC_OR_DEPARTMENT_OTHER): Admitting: Obstetrics & Gynecology

## 2024-09-28 LAB — CBC WITH DIFFERENTIAL/PLATELET
Basophils Absolute: 0 K/uL (ref 0.0–0.1)
Basophils Relative: 0.7 % (ref 0.0–3.0)
Eosinophils Absolute: 0.5 K/uL (ref 0.0–0.7)
Eosinophils Relative: 9.3 % — ABNORMAL HIGH (ref 0.0–5.0)
HCT: 45.4 % (ref 36.0–46.0)
Hemoglobin: 15.9 g/dL — ABNORMAL HIGH (ref 12.0–15.0)
Lymphocytes Relative: 27.6 % (ref 12.0–46.0)
Lymphs Abs: 1.5 K/uL (ref 0.7–4.0)
MCHC: 35 g/dL (ref 30.0–36.0)
MCV: 95.4 fl (ref 78.0–100.0)
Monocytes Absolute: 0.3 K/uL (ref 0.1–1.0)
Monocytes Relative: 5.5 % (ref 3.0–12.0)
Neutro Abs: 3.1 K/uL (ref 1.4–7.7)
Neutrophils Relative %: 56.9 % (ref 43.0–77.0)
Platelets: 217 K/uL (ref 150.0–400.0)
RBC: 4.76 Mil/uL (ref 3.87–5.11)
RDW: 12.5 % (ref 11.5–15.5)
WBC: 5.4 K/uL (ref 4.0–10.5)

## 2024-09-28 LAB — COMPREHENSIVE METABOLIC PANEL WITH GFR
ALT: 17 U/L (ref 0–35)
AST: 23 U/L (ref 0–37)
Albumin: 4.6 g/dL (ref 3.5–5.2)
Alkaline Phosphatase: 99 U/L (ref 39–117)
BUN: 11 mg/dL (ref 6–23)
CO2: 24 meq/L (ref 19–32)
Calcium: 9.8 mg/dL (ref 8.4–10.5)
Chloride: 103 meq/L (ref 96–112)
Creatinine, Ser: 0.69 mg/dL (ref 0.40–1.20)
GFR: 108.02 mL/min (ref >60.00)
Glucose, Bld: 99 mg/dL (ref 70–99)
Potassium: 3.8 meq/L (ref 3.5–5.1)
Sodium: 138 meq/L (ref 135–145)
Total Bilirubin: 0.6 mg/dL (ref 0.2–1.2)
Total Protein: 7.5 g/dL (ref 6.0–8.3)

## 2024-09-28 LAB — TSH: TSH: 1.3 u[IU]/mL (ref 0.35–5.50)

## 2024-09-28 LAB — LIPID PANEL
Cholesterol: 190 mg/dL (ref 0–200)
HDL: 58.4 mg/dL (ref >39.00)
LDL Cholesterol: 108 mg/dL — ABNORMAL HIGH (ref 0–99)
NonHDL: 131.64
Total CHOL/HDL Ratio: 3
Triglycerides: 120 mg/dL (ref 0.0–149.0)
VLDL: 24 mg/dL (ref 0.0–40.0)

## 2024-09-28 LAB — HEMOGLOBIN A1C: Hgb A1c MFr Bld: 5.9 % (ref 4.6–6.5)

## 2024-09-28 NOTE — Progress Notes (Signed)
 Labs ok except  A1C(3 month average of sugars) is elevated.  This is considered PreDiabetes.  Work on diet-decrease sugars and starches and aim for 30 minutes of exercise 5 days/week to prevent progression to diabetes  Hgb almost too high.  Repeat cbcd 2-3 months

## 2024-09-29 ENCOUNTER — Encounter (HOSPITAL_BASED_OUTPATIENT_CLINIC_OR_DEPARTMENT_OTHER): Payer: Self-pay | Admitting: Obstetrics & Gynecology

## 2024-09-29 ENCOUNTER — Ambulatory Visit (INDEPENDENT_AMBULATORY_CARE_PROVIDER_SITE_OTHER): Admitting: Obstetrics & Gynecology

## 2024-09-29 VITALS — BP 117/85 | HR 90 | Ht 64.0 in | Wt 158.8 lb

## 2024-09-29 DIAGNOSIS — Z9889 Other specified postprocedural states: Secondary | ICD-10-CM

## 2024-09-29 NOTE — Telephone Encounter (Signed)
-----   Message from Jenkins CHRISTELLA Carrel sent at 09/28/2024  8:18 PM EDT ----- Labs ok except  A1C(3 month average of sugars) is elevated.  This is considered PreDiabetes.  Work on diet-decrease sugars and starches and aim for 30 minutes of exercise 5 days/week to prevent progression to  diabetes  Hgb almost too high.  Repeat cbcd 2-3 months ----- Message ----- From: Interface, Lab In Three Zero One Sent: 09/28/2024   7:48 PM EDT To: Jenkins Earnie Carrel, MD

## 2024-09-29 NOTE — Telephone Encounter (Signed)
 Tried to reach out to patient to set up 67mo follow up appt to repeat labs (CBCD) per Dr Wendolyn but no answer and unable to leave VM.

## 2024-09-29 NOTE — Progress Notes (Signed)
 GYNECOLOGY  VISIT  CC:   post op recheck  HPI: 43 y.o. G0P0000 Single Caucasian female here for second recheck after undergoing Total Laparoscopic Hysterectomy with Bilateral Salpinngectomy on 08/23/2024.  She reports bleeding is none. She had some light bleeding a couple of weeks ago but has since resolved.  She has no pain.  Bowel function is Normal.  Bladder function is normal.  Still having some hot flashes but this has improved.  The frequency and duration has improved.  Options for treatment discussed.  She really doesn't want to take a medication right now if she doesn't have to.  Norethindrone , estroven, estradiol pill or patch discussed.  She's going to wait to start anything prescription.     MEDS:   Current Outpatient Medications on File Prior to Visit  Medication Sig Dispense Refill   cetirizine (ZYRTEC) 10 MG tablet Take 10 mg by mouth daily as needed for allergies.     omeprazole (PRILOSEC) 20 MG capsule Take 20 mg by mouth daily as needed.     valACYclovir  (VALTREX ) 1000 MG tablet Take 2 tablets (2,000 mg total) by mouth onset and repeat in 12 hours 30 tablet 1   ibuprofen  (ADVIL ) 800 MG tablet Take 1 tablet (800 mg total) by mouth every 8 (eight) hours as needed. 30 tablet 0   No current facility-administered medications on file prior to visit.    SH:  Smoking No    PHYSICAL EXAMINATION:    BP 117/85 (BP Location: Left Arm, Patient Position: Sitting, Cuff Size: Normal)   Pulse 90   Ht 5' 4 (1.626 m)   Wt 158 lb 12.8 oz (72 kg)   LMP  (LMP Unknown)   SpO2 100%   BMI 27.26 kg/m     General appearance: alert, cooperative and appears stated age Abdomen: soft, non-tender; bowel sounds normal; no masses,  no organomegaly Incisions:  C/D/I  Pelvic: External genitalia:  no lesions              Urethra:  normal appearing urethra with no masses, tenderness or lesions              Bartholins and Skenes: normal                 Vagina: normal appearing vagina with normal  color and discharge, no lesions, cuff well healed, no bleeding, intact              Cervix: absent              Bimanual Exam:  Uterus:  uterus absent              Adnexa: no mass, fullness, tenderness  Chaperone was present for exam.  Assessment/Plan: 1. Post-operative state (Primary) - doing well.  Return to work planned mid October.  Feel should have lifting restrictions for 8 weeks post op.  She is going to reach out to HR to see what I need to do for this.  FMLA paperwork has already been completed previously.

## 2024-10-05 ENCOUNTER — Encounter (HOSPITAL_BASED_OUTPATIENT_CLINIC_OR_DEPARTMENT_OTHER): Payer: Self-pay

## 2024-10-10 ENCOUNTER — Other Ambulatory Visit (HOSPITAL_BASED_OUTPATIENT_CLINIC_OR_DEPARTMENT_OTHER): Payer: Self-pay | Admitting: Obstetrics & Gynecology

## 2024-10-10 DIAGNOSIS — Z1231 Encounter for screening mammogram for malignant neoplasm of breast: Secondary | ICD-10-CM

## 2024-10-10 DIAGNOSIS — K219 Gastro-esophageal reflux disease without esophagitis: Secondary | ICD-10-CM

## 2024-10-10 DIAGNOSIS — Z862 Personal history of diseases of the blood and blood-forming organs and certain disorders involving the immune mechanism: Secondary | ICD-10-CM

## 2024-10-10 DIAGNOSIS — D251 Intramural leiomyoma of uterus: Secondary | ICD-10-CM

## 2024-10-10 DIAGNOSIS — N920 Excessive and frequent menstruation with regular cycle: Secondary | ICD-10-CM

## 2024-10-10 DIAGNOSIS — R2231 Localized swelling, mass and lump, right upper limb: Secondary | ICD-10-CM

## 2024-10-10 DIAGNOSIS — Z720 Tobacco use: Secondary | ICD-10-CM

## 2024-10-12 ENCOUNTER — Other Ambulatory Visit (HOSPITAL_BASED_OUTPATIENT_CLINIC_OR_DEPARTMENT_OTHER): Payer: Self-pay | Admitting: Obstetrics & Gynecology

## 2024-10-12 DIAGNOSIS — K219 Gastro-esophageal reflux disease without esophagitis: Secondary | ICD-10-CM

## 2024-10-12 DIAGNOSIS — R2231 Localized swelling, mass and lump, right upper limb: Secondary | ICD-10-CM

## 2024-10-12 DIAGNOSIS — D251 Intramural leiomyoma of uterus: Secondary | ICD-10-CM

## 2024-10-12 DIAGNOSIS — Z1231 Encounter for screening mammogram for malignant neoplasm of breast: Secondary | ICD-10-CM

## 2024-10-12 DIAGNOSIS — Z862 Personal history of diseases of the blood and blood-forming organs and certain disorders involving the immune mechanism: Secondary | ICD-10-CM

## 2024-10-12 DIAGNOSIS — Z720 Tobacco use: Secondary | ICD-10-CM

## 2024-10-12 DIAGNOSIS — N920 Excessive and frequent menstruation with regular cycle: Secondary | ICD-10-CM

## 2024-10-14 ENCOUNTER — Other Ambulatory Visit (HOSPITAL_BASED_OUTPATIENT_CLINIC_OR_DEPARTMENT_OTHER): Payer: Self-pay | Admitting: Obstetrics & Gynecology

## 2024-10-14 ENCOUNTER — Encounter (HOSPITAL_BASED_OUTPATIENT_CLINIC_OR_DEPARTMENT_OTHER): Payer: Self-pay | Admitting: Obstetrics & Gynecology

## 2024-10-14 ENCOUNTER — Other Ambulatory Visit (HOSPITAL_COMMUNITY): Payer: Self-pay

## 2024-10-14 DIAGNOSIS — N898 Other specified noninflammatory disorders of vagina: Secondary | ICD-10-CM

## 2024-10-14 MED ORDER — FLUCONAZOLE 150 MG PO TABS
150.0000 mg | ORAL_TABLET | Freq: Once | ORAL | 0 refills | Status: AC
  Filled 2024-10-14: qty 2, 3d supply, fill #0

## 2024-10-19 ENCOUNTER — Ambulatory Visit
Admission: RE | Admit: 2024-10-19 | Discharge: 2024-10-19 | Disposition: A | Discharge status: Home / Self Care | Source: Ambulatory Visit | Attending: Family Medicine | Admitting: Family Medicine

## 2024-10-19 ENCOUNTER — Ambulatory Visit: Payer: Self-pay | Admitting: Family Medicine

## 2024-10-19 ENCOUNTER — Ambulatory Visit
Admission: RE | Admit: 2024-10-19 | Discharge: 2024-10-19 | Disposition: A | Source: Ambulatory Visit | Attending: Obstetrics & Gynecology | Admitting: Obstetrics & Gynecology

## 2024-10-19 DIAGNOSIS — R2231 Localized swelling, mass and lump, right upper limb: Secondary | ICD-10-CM

## 2024-10-19 DIAGNOSIS — R928 Other abnormal and inconclusive findings on diagnostic imaging of breast: Secondary | ICD-10-CM | POA: Diagnosis not present

## 2024-10-19 DIAGNOSIS — R229 Localized swelling, mass and lump, unspecified: Secondary | ICD-10-CM | POA: Diagnosis not present

## 2024-10-26 ENCOUNTER — Other Ambulatory Visit (HOSPITAL_BASED_OUTPATIENT_CLINIC_OR_DEPARTMENT_OTHER): Payer: Self-pay

## 2024-10-26 ENCOUNTER — Ambulatory Visit (INDEPENDENT_AMBULATORY_CARE_PROVIDER_SITE_OTHER): Admitting: Obstetrics & Gynecology

## 2024-10-26 ENCOUNTER — Other Ambulatory Visit (HOSPITAL_COMMUNITY)
Admission: RE | Admit: 2024-10-26 | Discharge: 2024-10-26 | Disposition: A | Discharge status: Home / Self Care | Source: Ambulatory Visit | Attending: Obstetrics & Gynecology | Admitting: Obstetrics & Gynecology

## 2024-10-26 ENCOUNTER — Encounter (HOSPITAL_BASED_OUTPATIENT_CLINIC_OR_DEPARTMENT_OTHER): Payer: Self-pay | Admitting: Obstetrics & Gynecology

## 2024-10-26 VITALS — BP 127/86 | HR 93 | Ht 64.0 in | Wt 161.2 lb

## 2024-10-26 DIAGNOSIS — N898 Other specified noninflammatory disorders of vagina: Secondary | ICD-10-CM

## 2024-10-26 MED ORDER — TRIAMCINOLONE ACETONIDE 0.5 % EX OINT
1.0000 | TOPICAL_OINTMENT | Freq: Two times a day (BID) | CUTANEOUS | 0 refills | Status: AC
  Filled 2024-10-26: qty 30, 15d supply, fill #0

## 2024-10-26 NOTE — Progress Notes (Signed)
   GYNECOLOGY  VISIT  CC:   Follow-up (Patient reports that she is still having vaginal irritation. She denies itchiness, odor, discharge or burning. )   HPI: 43 y.o. G0P0000 Single Unavailable female here for vaginal irritation. She denies itchiness, odor, discharge or burning.  Irritation is more external.  She has used antifungal treatment that really didn't fully resolve issue.  Back at work.  Did have some abdominal discomfort after a very busy day.  Is being a little more careful when she can.  No LMP recorded (lmp unknown). Patient has had a hysterectomy.  Past Medical History:  Diagnosis Date   GERD (gastroesophageal reflux disease)    Iron deficiency anemia due to chronic blood loss    secondary to uterine fibroid  improvement w/ lupron  injection   Uterine fibroid     MEDS:  Reviewed in EPIC  ALLERGIES: Patient has no known allergies.  SH:  single, social smoking  Review of Systems  Constitutional: Negative.   Genitourinary:        Vaginal irritation    PHYSICAL EXAMINATION:    BP 127/86 (BP Location: Left Arm, Patient Position: Sitting, Cuff Size: Normal)   Pulse 93   Ht 5' 4 (1.626 m)   Wt 161 lb 3.2 oz (73.1 kg)   LMP  (LMP Unknown)   SpO2 100%   BMI 27.67 kg/m     General appearance: alert, cooperative and appears stated age Lymph:  no inguinal LAD noted  Pelvic: External genitalia:  no lesions, erythema of lower inner labia majora down towards rectum              Urethra:  normal appearing urethra with no masses, tenderness or lesions              Bartholins and Skenes: normal                 Vagina: normal mucosa without prolapse or lesions              Cervix: surgically absent, no lesions, vaginitis swab obtained, cuff well approximated, no visible sutures    Chaperone was present for exam.  Assessment/Plan: 1. Vaginal irritation (Primary) - 1. Vaginal irritation (Primary) - given she has recently taken anti-fungal, will test for yeast and BV  but go ahead and proceed with topical steroid ointment at vulvar skin does appear irritated.  Will add additional treatment if  - Cervicovaginal ancillary only( [place]) - triamcinolone  ointment (KENALOG ) 0.5 %; Apply 1 Application topically 2 (two) times daily.  Dispense: 30 g; Refill: 0

## 2024-10-28 ENCOUNTER — Ambulatory Visit (HOSPITAL_BASED_OUTPATIENT_CLINIC_OR_DEPARTMENT_OTHER): Payer: Self-pay | Admitting: Obstetrics & Gynecology

## 2024-10-28 LAB — CERVICOVAGINAL ANCILLARY ONLY
Bacterial Vaginitis (gardnerella): NEGATIVE
Comment: NEGATIVE
Comment: NEGATIVE
Trichomonas: NEGATIVE

## 2025-10-03 ENCOUNTER — Encounter: Admitting: Family Medicine
